# Patient Record
Sex: Male | Born: 1990 | Race: White | Hispanic: No | Marital: Single | Smoking: Current every day smoker
Health system: Southern US, Community
[De-identification: ages and names within clinical notes are randomized; demographics above are authoritative.]

## PROBLEM LIST (undated history)

## (undated) DIAGNOSIS — K219 Gastro-esophageal reflux disease without esophagitis: Secondary | ICD-10-CM

## (undated) DIAGNOSIS — IMO0001 Reserved for inherently not codable concepts without codable children: Secondary | ICD-10-CM

## (undated) DIAGNOSIS — A4902 Methicillin resistant Staphylococcus aureus infection, unspecified site: Secondary | ICD-10-CM

## (undated) DIAGNOSIS — K449 Diaphragmatic hernia without obstruction or gangrene: Secondary | ICD-10-CM

## (undated) HISTORY — DX: Diaphragmatic hernia without obstruction or gangrene: K44.9

## (undated) HISTORY — DX: Gastro-esophageal reflux disease without esophagitis: K21.9

---

## 2011-04-12 ENCOUNTER — Emergency Department: Payer: Self-pay | Admitting: Emergency Medicine

## 2011-04-28 ENCOUNTER — Emergency Department: Payer: Self-pay | Admitting: Emergency Medicine

## 2014-10-30 ENCOUNTER — Emergency Department: Payer: Self-pay | Admitting: Emergency Medicine

## 2014-10-30 LAB — CBC WITH DIFFERENTIAL/PLATELET
BASOS ABS: 0.1 10*3/uL (ref 0.0–0.1)
Basophil %: 0.4 %
Eosinophil #: 0.4 10*3/uL (ref 0.0–0.7)
Eosinophil %: 3 %
HCT: 46.3 % (ref 40.0–52.0)
HGB: 15.2 g/dL (ref 13.0–18.0)
LYMPHS ABS: 1.9 10*3/uL (ref 1.0–3.6)
Lymphocyte %: 13.9 %
MCH: 30.7 pg (ref 26.0–34.0)
MCHC: 32.9 g/dL (ref 32.0–36.0)
MCV: 93 fL (ref 80–100)
Monocyte #: 1 x10 3/mm (ref 0.2–1.0)
Monocyte %: 7.2 %
NEUTROS ABS: 10.4 10*3/uL — AB (ref 1.4–6.5)
Neutrophil %: 75.5 %
Platelet: 205 10*3/uL (ref 150–440)
RBC: 4.96 10*6/uL (ref 4.40–5.90)
RDW: 13.3 % (ref 11.5–14.5)
WBC: 13.7 10*3/uL — ABNORMAL HIGH (ref 3.8–10.6)

## 2014-10-30 LAB — COMPREHENSIVE METABOLIC PANEL
ALBUMIN: 4 g/dL (ref 3.4–5.0)
ALK PHOS: 88 U/L
AST: 46 U/L — AB (ref 15–37)
Anion Gap: 15 (ref 7–16)
BILIRUBIN TOTAL: 0.6 mg/dL (ref 0.2–1.0)
BUN: 10 mg/dL (ref 7–18)
CO2: 24 mmol/L (ref 21–32)
CREATININE: 0.8 mg/dL (ref 0.60–1.30)
Calcium, Total: 9 mg/dL (ref 8.5–10.1)
Chloride: 108 mmol/L — ABNORMAL HIGH (ref 98–107)
EGFR (African American): >60
EGFR (Non-African Amer.): >60
Glucose: 102 mg/dL — ABNORMAL HIGH (ref 65–99)
OSMOLALITY: 292 (ref 275–301)
Potassium: 4.5 mmol/L (ref 3.5–5.1)
SGPT (ALT): 42 U/L
Sodium: 147 mmol/L — ABNORMAL HIGH (ref 136–145)
Total Protein: 7.9 g/dL (ref 6.4–8.2)

## 2014-11-25 ENCOUNTER — Emergency Department: Payer: Self-pay | Admitting: Emergency Medicine

## 2014-12-24 ENCOUNTER — Emergency Department: Payer: Self-pay | Admitting: Emergency Medicine

## 2015-08-05 ENCOUNTER — Emergency Department
Admission: EM | Admit: 2015-08-05 | Discharge: 2015-08-05 | Disposition: A | Discharge status: Home / Self Care | Payer: Self-pay | Attending: Emergency Medicine | Admitting: Emergency Medicine

## 2015-08-05 ENCOUNTER — Encounter: Payer: Self-pay | Admitting: Emergency Medicine

## 2015-08-05 DIAGNOSIS — Z720 Tobacco use: Secondary | ICD-10-CM | POA: Insufficient documentation

## 2015-08-05 DIAGNOSIS — K625 Hemorrhage of anus and rectum: Secondary | ICD-10-CM | POA: Insufficient documentation

## 2015-08-05 LAB — CBC WITH DIFFERENTIAL/PLATELET
Basophils Absolute: 0.1 10*3/uL (ref 0–0.1)
Basophils Relative: 1 %
EOS ABS: 0.3 10*3/uL (ref 0–0.7)
EOS PCT: 3 %
HCT: 44.9 % (ref 40.0–52.0)
Hemoglobin: 15.2 g/dL (ref 13.0–18.0)
LYMPHS ABS: 2.2 10*3/uL (ref 1.0–3.6)
Lymphocytes Relative: 23 %
MCH: 30.4 pg (ref 26.0–34.0)
MCHC: 33.8 g/dL (ref 32.0–36.0)
MCV: 89.9 fL (ref 80.0–100.0)
Monocytes Absolute: 0.6 10*3/uL (ref 0.2–1.0)
Monocytes Relative: 6 %
Neutro Abs: 6.5 10*3/uL (ref 1.4–6.5)
Neutrophils Relative %: 67 %
PLATELETS: 182 10*3/uL (ref 150–440)
RBC: 5 MIL/uL (ref 4.40–5.90)
RDW: 13.6 % (ref 11.5–14.5)
WBC: 9.8 10*3/uL (ref 3.8–10.6)

## 2015-08-05 LAB — COMPREHENSIVE METABOLIC PANEL
ALT: 23 U/L (ref 17–63)
AST: 20 U/L (ref 15–41)
Albumin: 4.7 g/dL (ref 3.5–5.0)
Alkaline Phosphatase: 61 U/L (ref 38–126)
Anion gap: 6 (ref 5–15)
BUN: 10 mg/dL (ref 6–20)
CALCIUM: 9.4 mg/dL (ref 8.9–10.3)
CHLORIDE: 107 mmol/L (ref 101–111)
CO2: 27 mmol/L (ref 22–32)
Creatinine, Ser: 0.81 mg/dL (ref 0.61–1.24)
GFR calc non Af Amer: >60 mL/min (ref >60)
Glucose, Bld: 111 mg/dL — ABNORMAL HIGH (ref 65–99)
Potassium: 4.3 mmol/L (ref 3.5–5.1)
SODIUM: 140 mmol/L (ref 135–145)
Total Bilirubin: 0.7 mg/dL (ref 0.3–1.2)
Total Protein: 7.6 g/dL (ref 6.5–8.1)

## 2015-08-05 MED ORDER — HYDROCORTISONE ACETATE 25 MG RE SUPP: 25.0000 mg | Freq: Two times a day (BID) | RECTAL | Status: DC

## 2015-08-05 NOTE — ED Provider Notes (Signed)
[facility] Emergency Department Provider Note     Time seen: ----------------------------------------- 10:55 AM on 08/05/2015 -----------------------------------------    I have reviewed the triage vital signs and the nursing notes.   HISTORY  Chief Complaint Rectal Bleeding    HPI Marcus Hill is a 24 y.o. male who presents ER with bloody stools 3 this morning. Patient reports some pain rectally, has had this happen before with an anal fissure he states patient has describes lower abdominal pain, nothing makes it better or worse. Patient states had diarrhea for the last 2 months almost every day.   History reviewed. No pertinent past medical history.  There are no active problems to display for this patient.   History reviewed. No pertinent past surgical history.  Allergies Review of patient's allergies indicates no known allergies.  Social History Social History  Substance Use Topics  . Smoking status: Current Every Day Smoker  . Smokeless tobacco: None  . Alcohol Use: No    Review of Systems Constitutional: Negative for fever. Eyes: Negative for visual changes. ENT: Negative for sore throat. Cardiovascular: Negative for chest pain. Respiratory: Negative for shortness of breath. Gastrointestinal: Positive for abdominal pain and diarrhea, rectal bleeding Genitourinary: Negative for dysuria. Musculoskeletal: Negative for back pain. Skin: Negative for rash. Neurological: Negative for headaches, focal weakness or numbness.  10-point ROS otherwise negative.  ____________________________________________   PHYSICAL EXAM:  VITAL SIGNS: ED Triage Vitals  Enc Vitals Group     BP 08/05/15 1007 158/79 mmHg     Pulse Rate 08/05/15 1007 53     Resp 08/05/15 1007 20     Temp 08/05/15 1007 98.2 F (36.8 C)     Temp Source 08/05/15 1007 Oral     SpO2 08/05/15 1007 99 %     Weight 08/05/15 1007 203 lb (92.08 kg)     Height  08/05/15 1007  (1.727 m)     Head Cir --      Peak Flow --      Pain Score 08/05/15 1008 2     Pain Loc --      Pain Edu? --      Excl. in GC? --     Constitutional: Alert and oriented. Well appearing and in no distress. Eyes: Conjunctivae are normal. PERRL. Normal extraocular movements. ENT   Head: Normocephalic and atraumatic.   Nose: No congestion/rhinnorhea.   Mouth/Throat: Mucous membranes are moist.   Neck: No stridor. Cardiovascular: Normal rate, regular rhythm. Normal and symmetric distal pulses are present in all extremities. No murmurs, rubs, or gallops. Respiratory: Normal respiratory effort without tachypnea nor retractions. Breath sounds are clear and equal bilaterally. No wheezes/rales/rhonchi. Gastrointestinal: Soft and nontender. No distention. No abdominal bruits.  Rectal: No obvious hemorrhoids are appreciated, there was rectal tenderness present. He is heme-negative Musculoskeletal: Nontender with normal range of motion in all extremities. No joint effusions.  No lower extremity tenderness nor edema. Neurologic:  Normal speech and language. No gross focal neurologic deficits are appreciated. Speech is normal. No gait instability. Skin:  Skin is warm, dry and intact. No rash noted. Psychiatric: Mood and affect are normal. Speech and behavior are normal. Patient exhibits appropriate insight and judgment. ____________________________________________  ED COURSE:  Pertinent labs & imaging results that were available during my care of the patient were reviewed by me and considered in my medical decision making (see chart for details). We'll check basic labs and reevaluate. ____________________________________________    LABS (pertinent positives/negatives)  Labs Reviewed  COMPREHENSIVE METABOLIC PANEL - Abnormal; Notable for the following:    Glucose, Bld 111 (*)    All other components within normal limits  CBC WITH DIFFERENTIAL/PLATELET    ____________________________________________  FINAL ASSESSMENT AND PLAN  Rectal bleeding  Plan: Patient with labs as dictated above. Labs are unremarkable, he is stable for discharge with Anusol suppositories and GI referral.   Emily Filbert, MD   Emily Filbert, MD 08/05/15 1128

## 2015-08-05 NOTE — Discharge Instructions (Signed)
Rectal Bleeding °Rectal bleeding is when blood passes out of the anus. It is usually a sign that something is wrong. It may not be serious, but it should always be evaluated. Rectal bleeding may present as bright red blood or extremely dark stools. The color may range from dark red or maroon to black (like tar). It is important that the cause of rectal bleeding be identified so treatment can be started and the problem corrected. °CAUSES  °· Hemorrhoids. These are enlarged (dilated) blood vessels or veins in the anal or rectal area. °· Fistulas. These are abnormal, burrowing channels that usually run from inside the rectum to the skin around the anus. They can bleed. °· Anal fissures. This is a tear in the tissue of the anus. Bleeding occurs with bowel movements. °· Diverticulosis. This is a condition in which pockets or sacs project from the bowel wall. Occasionally, the sacs can bleed. °· Diverticulitis. This is an infection involving diverticulosis of the colon. °· Proctitis and colitis. These are conditions in which the rectum, colon, or both, can become inflamed and pitted (ulcerated). °· Polyps and cancer. Polyps are non-cancerous (benign) growths in the colon that may bleed. Certain types of polyps turn into cancer. °· Protrusion of the rectum. Part of the rectum can project from the anus and bleed. °· Certain medicines. °· Intestinal infections. °· Blood vessel abnormalities. °HOME CARE INSTRUCTIONS °· Eat a high-fiber diet to keep your stool soft. °· Limit activity. °· Drink enough fluids to keep your urine clear or pale yellow. °· Warm baths may be useful to soothe rectal pain. °· Follow up with your caregiver as directed. °SEEK IMMEDIATE MEDICAL CARE IF: °· You develop increased bleeding. °· You have black or dark red stools. °· You vomit blood or material that looks like coffee grounds. °· You have abdominal pain or tenderness. °· You have a fever. °· You feel weak, nauseous, or you faint. °· You have  severe rectal pain or you are unable to have a bowel movement. °MAKE SURE YOU: °· Understand these instructions. °· Will watch your condition. °· Will get help right away if you are not doing well or get worse. °Document Released: 04/10/2002 Document Revised: 01/11/2012 Document Reviewed: 04/05/2011 °Exit[employer]® Patient Information ©2015 [employer]. This information is not intended to replace advice given to you by your health care provider. Make sure you discuss any questions you have with your health care provider. ° °

## 2015-08-05 NOTE — ED Notes (Signed)
Pt to ed with c/o bloody stools x 3 this am.  Pt states bright red blood and lower abd pain.

## 2015-09-19 ENCOUNTER — Emergency Department
Admission: EM | Admit: 2015-09-19 | Discharge: 2015-09-19 | Disposition: A | Discharge status: Home / Self Care | Payer: 59 | Attending: Emergency Medicine | Admitting: Emergency Medicine

## 2015-09-19 ENCOUNTER — Encounter: Payer: Self-pay | Admitting: *Deleted

## 2015-09-19 DIAGNOSIS — R1110 Vomiting, unspecified: Secondary | ICD-10-CM

## 2015-09-19 DIAGNOSIS — R112 Nausea with vomiting, unspecified: Secondary | ICD-10-CM | POA: Insufficient documentation

## 2015-09-19 DIAGNOSIS — Z720 Tobacco use: Secondary | ICD-10-CM | POA: Insufficient documentation

## 2015-09-19 DIAGNOSIS — Z7952 Long term (current) use of systemic steroids: Secondary | ICD-10-CM | POA: Diagnosis not present

## 2015-09-19 HISTORY — DX: Methicillin resistant Staphylococcus aureus infection, unspecified site: A49.02

## 2015-09-19 LAB — LIPASE, BLOOD: LIPASE: 26 U/L (ref 11–51)

## 2015-09-19 LAB — COMPREHENSIVE METABOLIC PANEL
ALBUMIN: 4.4 g/dL (ref 3.5–5.0)
ALK PHOS: 58 U/L (ref 38–126)
ALT: 21 U/L (ref 17–63)
ANION GAP: 8 (ref 5–15)
AST: 19 U/L (ref 15–41)
BUN: 13 mg/dL (ref 6–20)
CALCIUM: 9.6 mg/dL (ref 8.9–10.3)
CO2: 26 mmol/L (ref 22–32)
Chloride: 106 mmol/L (ref 101–111)
Creatinine, Ser: 0.83 mg/dL (ref 0.61–1.24)
GFR calc non Af Amer: >60 mL/min (ref >60)
Glucose, Bld: 99 mg/dL (ref 65–99)
POTASSIUM: 3.7 mmol/L (ref 3.5–5.1)
SODIUM: 140 mmol/L (ref 135–145)
Total Bilirubin: 0.5 mg/dL (ref 0.3–1.2)
Total Protein: 7.7 g/dL (ref 6.5–8.1)

## 2015-09-19 LAB — CBC
HEMATOCRIT: 43.2 % (ref 40.0–52.0)
HEMOGLOBIN: 14.9 g/dL (ref 13.0–18.0)
MCH: 30.9 pg (ref 26.0–34.0)
MCHC: 34.6 g/dL (ref 32.0–36.0)
MCV: 89.3 fL (ref 80.0–100.0)
Platelets: 175 10*3/uL (ref 150–440)
RBC: 4.84 MIL/uL (ref 4.40–5.90)
RDW: 13.2 % (ref 11.5–14.5)
WBC: 8.5 10*3/uL (ref 3.8–10.6)

## 2015-09-19 MED ORDER — ONDANSETRON 4 MG PO TBDP: 4.0000 mg | ORAL_TABLET | Freq: Three times a day (TID) | ORAL | Status: DC | PRN

## 2015-09-19 MED ORDER — PROMETHAZINE HCL 25 MG RE SUPP: 25.0000 mg | Freq: Four times a day (QID) | RECTAL | Status: DC | PRN

## 2015-09-19 NOTE — ED Notes (Signed)
Vomiting past month, daily, states has lost 22 lbs

## 2015-09-19 NOTE — ED Provider Notes (Signed)
[facility] Emergency Department Provider Note  ____________________________________________  Time seen: Approximately 4:42 PM  I have reviewed the triage vital signs and the nursing notes.   HISTORY  Chief Complaint Emesis    HPI Marcus Hill is a 24 y.o. male , with a history of tobacco abuse and marijuana abuse, presenting with vomiting. Patient reports that over the last 2 months he has had daily vomiting. It usually occurs in the morning, and occasionally in the afternoon. This vomiting occurs before he eats anything. During the rest of the day, he is able to keep down foods but notices that he vomits more if he eats greasy or fast foods. He is able to tolerate high fiber foods like salad better. He reports a significant weight loss over the last 2 weeks. He denies any abdominal pain, diarrhea, constipation, headache, changes in gait, changes in vision or speech, changes in mental status, fever or chills. He has not had any urinary symptoms. He does report that he uses marijuana at least once a week.   Past Medical History  Diagnosis Date  . MRSA infection     There are no active problems to display for this patient.   History reviewed. No pertinent past surgical history.  Current Outpatient Rx  Name  Route  Sig  Dispense  Refill  . hydrocortisone (ANUSOL-HC) 25 MG suppository   Rectal   Place 1 suppository (25 mg total) rectally 2 (two) times daily.   12 suppository   0   . ondansetron (ZOFRAN ODT) 4 MG disintegrating tablet   Oral   Take 1 tablet (4 mg total) by mouth every 8 (eight) hours as needed for nausea or vomiting.   20 tablet   0   . promethazine (PHENERGAN) 25 MG suppository   Rectal   Place 1 suppository (25 mg total) rectally every 6 (six) hours as needed for nausea.   12 suppository   0     Allergies Review of patient's allergies indicates no known allergies.  No family history on file.  Social History Social  History  Substance Use Topics  . Smoking status: Current Every Day Smoker  . Smokeless tobacco: None  . Alcohol Use: No    Review of Systems Constitutional: No fever/chills. No lightheadedness or syncope. Eyes: No visual changes. No blurred or double vision. ENT: No sore throat. Cardiovascular: Denies chest pain, palpitations. Respiratory: Denies shortness of breath.  No cough. Gastrointestinal: No abdominal pain.  Positive nausea, positive vomiting.  No diarrhea.  No constipation. Genitourinary: Negative for dysuria. Musculoskeletal: Negative for back pain. Skin: Negative for rash. Neurological: Negative for headaches, focal weakness or numbness.  10-point ROS otherwise negative.  ____________________________________________   PHYSICAL EXAM:  VITAL SIGNS: ED Triage Vitals  Enc Vitals Group     BP 09/19/15 1553 161/98 mmHg     Pulse Rate 09/19/15 1553 74     Resp 09/19/15 1553 20     Temp 09/19/15 1553 99 F (37.2 C)     Temp Source 09/19/15 1553 Oral     SpO2 09/19/15 1553 98 %     Weight 09/19/15 1553 195 lb (88.451 kg)     Height 09/19/15 1553  (1.753 m)     Head Cir --      Peak Flow --      Pain Score 09/19/15 1554 5     Pain Loc --      Pain Edu? --  Excl. in GC? --     Constitutional: Alert and oriented. Well appearing and in no acute distress. Answer question appropriately. Eyes: Conjunctivae are normal.  EOMI. Head: Atraumatic. Nose: No congestion/rhinnorhea. Mouth/Throat: Mucous membranes are moist.  Neck: No stridor.  Supple.   Cardiovascular: Normal rate, regular rhythm. No murmurs, rubs or gallops.  Respiratory: Normal respiratory effort.  No retractions. Lungs CTAB.  No wheezes, rales or ronchi. Gastrointestinal: Abdomen is soft, nontender and nondistended. He has no guarding or rebound, negative Murphy sign, no peritoneal signs. Musculoskeletal: No LE edema.  Neurologic:  Normal speech and language. No gross focal neurologic deficits are  appreciated.  Skin:  Skin is warm, dry and intact. No rash noted. Normal skin turgor. Psychiatric: Mood and affect are normal. Speech and behavior are normal.  Normal judgement.  ____________________________________________   LABS (all labs ordered are listed, but only abnormal results are displayed)  Labs Reviewed  LIPASE, BLOOD  COMPREHENSIVE METABOLIC PANEL  CBC   ____________________________________________  EKG  Not indicated ____________________________________________  RADIOLOGY  No results found.  ____________________________________________   PROCEDURES  Procedure(s) performed: None  Critical Care performed: No ____________________________________________   INITIAL IMPRESSION / ASSESSMENT AND PLAN / ED COURSE  Pertinent labs & imaging results that were available during my care of the patient were reviewed by me and considered in my medical decision making (see chart for details).  24 y.o. male with chronic marijuana abuse presenting with 2 months of daily vomiting. It is possible that the etiology of his vomiting is his marijuana abuse. His vomiting may also be due to poor food choices. Given that it has been going on for 2 months, and he is afebrile with no white count, and acute viral or bacterial infection is very unlikely. I would also consider gastroparesis. This would also be an atypical symptom for GERD or PUD, especially since he has no associated pain. I would also consider an acute intracranial pathology such as a mass, but without any headache or any other neurologic symptoms or findings on exam, this is much less likely. My plan today will be to evaluate his labs, and if he has any major abnormalities, we'll consider CT scan. However given that this is unlikely an acute or surgical pathology, CT scan will not be indicated if labs are reassuring given his reassuring vital signs and exam.  ----------------------------------------- 5:10 PM on  09/19/2015 -----------------------------------------  The patient remained stable and has not had any vomiting here. His labs are reassuring. I have written his discharge paperwork so that he can establish a primary care physician, and also follow up with a GI specialist as needed. He understands return precautions and follow-up instructions.  ____________________________________________  FINAL CLINICAL IMPRESSION(S) / ED DIAGNOSES  Final diagnoses:  Chronic vomiting      NEW MEDICATIONS STARTED DURING THIS VISIT:  New Prescriptions   ONDANSETRON (ZOFRAN ODT) 4 MG DISINTEGRATING TABLET    Take 1 tablet (4 mg total) by mouth every 8 (eight) hours as needed for nausea or vomiting.   PROMETHAZINE (PHENERGAN) 25 MG SUPPOSITORY    Place 1 suppository (25 mg total) rectally every 6 (six) hours as needed for nausea.     Rockne MenghiniAnne-Caroline Eulon Allnutt, MD 09/19/15 1711

## 2015-09-19 NOTE — Discharge Instructions (Signed)
Please stop smoking marijuana, as this can cause chronic vomiting as you have been experiencing. Please also stay away from any greasy or fried foods. For the next 24 hours, please take a full liquid diet. After that, advance to bland BRAT diet as tolerated. Next  Please establish a primary care physician for follow-up.  Please return to the emergency department if you develop abdominal pain, inability to keep down fluids, fever, or any other symptoms concerning to you.

## 2015-09-19 NOTE — ED Notes (Signed)
AAOx3.  Skin warm and dry.  Ambulates with easy and steady gait.  Posture upright and relaxed.  NAD 

## 2015-09-19 NOTE — ED Notes (Signed)
AAOx3/.  Skin warm and dry.  NAD.  Ambulates with easy and steady gait.  Posture relaxed and upright.

## 2015-10-17 ENCOUNTER — Encounter: Payer: Self-pay | Admitting: Family Medicine

## 2015-10-17 ENCOUNTER — Ambulatory Visit (INDEPENDENT_AMBULATORY_CARE_PROVIDER_SITE_OTHER): Payer: 59 | Admitting: Family Medicine

## 2015-10-17 VITALS — BP 108/69 | HR 54 | Temp 97.7°F | Ht 67.25 in | Wt 190.0 lb

## 2015-10-17 DIAGNOSIS — R634 Abnormal weight loss: Secondary | ICD-10-CM | POA: Diagnosis not present

## 2015-10-17 DIAGNOSIS — K529 Noninfective gastroenteritis and colitis, unspecified: Secondary | ICD-10-CM

## 2015-10-17 DIAGNOSIS — Z23 Encounter for immunization: Secondary | ICD-10-CM

## 2015-10-17 DIAGNOSIS — Z720 Tobacco use: Secondary | ICD-10-CM | POA: Diagnosis not present

## 2015-10-17 MED ORDER — TETANUS-DIPHTH-ACELL PERTUSSIS 5-2.5-18.5 LF-MCG/0.5 IM SUSP
0.5000 mL | Freq: Once | INTRAMUSCULAR | Status: AC
  Administered 2015-10-17: 0.5 mL via INTRAMUSCULAR

## 2015-10-17 NOTE — Patient Instructions (Signed)
We'll get labs and stool studies today Stay hydrated I am here to help you quit smoking if and when you are ready to stop

## 2015-10-17 NOTE — Progress Notes (Signed)
BP 108/69 mmHg  Pulse 54  Temp(Src) 97.7 F (36.5 C)  Ht 5' 7.25" (1.708 m)  Wt 190 lb (86.183 kg)  BMI 29.54 kg/m2  SpO2 100%   Subjective:    Patient ID: Marcus Hill, male    DOB: 10-01-1991, 24 y.o.   MRN: [id number]  HPI: Marcus Hill is a 24 y.o. male  Chief Complaint  Patient presents with  . 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, foreign packages  Mother told him to change his diet; he is not a good water drinker He has some pain along the left upper quadrant, just with straining, picking something up  He went to the ER on August 05, 2015 and we reviewed that note Diarrhea started around August 2016, daily since then Tenderness on exam noted  He was 203 pounds in the ER in October, 190 pounds today  Glutaric acidemia, 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 Past Medical History  Diagnosis Date  . MRSA infection    History reviewed. No pertinent past surgical history. Family History  Problem Relation Age of Onset  . Hypertension Mother   . Hyperlipidemia Mother   . Diabetes Father   . Heart disease Father   . Heart attack  Father   . Diabetes Maternal Grandmother   . Diabetes Maternal Grandfather   . Diabetes Paternal Grandmother   . Diabetes Paternal Grandfather   . Cancer Neg Hx   . COPD Neg Hx   . Stroke Neg Hx    Social History  Substance Use Topics  . Smoking status: Current Every Day Smoker -- 0.50 packs/day    Types: Cigarettes  . Smokeless tobacco: Never Used  . Alcohol Use: No   Allergies and medications reviewed and updated.  Review of Systems Per HPI unless specifically indicated above     Objective:    BP 108/69 mmHg  Pulse 54  Temp(Src) 97.7 F (36.5 C)  Ht 5' 7.25" (1.708 m)  Wt 190 lb (86.183 kg)  BMI 29.54 kg/m2  SpO2 100%  Wt Readings from Last 3 Encounters:  10/17/15 190 lb (86.183 kg)  09/19/15 195 lb (88.451 kg)  08/05/15 203 lb (92.08 kg)    Physical Exam  Constitutional: He appears well-developed and well-nourished. No distress.  HENT:  Mouth/Throat: No oropharyngeal exudate.  Eyes: EOM are normal. No scleral icterus.  Neck: No JVD present. No thyromegaly present.  Cardiovascular: Normal rate and regular rhythm.   Pulmonary/Chest: Effort normal and breath sounds normal.  Abdominal: Soft. Bowel sounds are normal. He exhibits no distension and no mass. There  is no tenderness. There is no guarding.  Musculoskeletal: He exhibits no edema.  Lymphadenopathy:    He has no cervical adenopathy.  Neurological: He is alert.  Skin: Skin is warm and dry. He is not diaphoretic. No pallor.  Psychiatric: He has a normal mood and affect. His behavior is normal. Judgment and thought content normal.      Assessment & Plan:   Problem List Items Addressed This Visit      Digestive   Chronic diarrhea - Primary    Going on for months; will start with celiac testing, ANA, stool studies; reviewed ER records; will refer to GI if not obvious cause      Relevant Orders   ANA w/Reflex if Positive (Completed)   Gliadin antibodies, serum (Completed)   Tissue transglutaminase,  IgA (Completed)   Reticulin Antibody, IgA w reflex titer (Completed)   TSH (Completed)   Stool Culture (Completed)   Ova and parasite examination   C-reactive protein (Completed)   Comprehensive metabolic panel (Completed)   CBC with Differential/Platelet (Completed)   Giardia/Cryptosporidium EIA (Completed)     Other   Abnormal weight loss    With daily diarrhea, suspect the two are linked; ddx includes hyperthyroidism, celiac disease, inflammatory bowel disease; check labs, refer if no obvious etiology      Relevant Orders   TSH (Completed)   Giardia/Cryptosporidium EIA (Completed)   Tobacco abuse    Encouraged him to consider quitting, as it will be the best thing he could do for his health and his wallet; I am here to help if and when he is ready and would like assistance (referral to counseling, Rx for Chantix, etc.)       Other Visit Diagnoses    Encounter for immunization        Need for Tdap vaccination        offered and given today    Relevant Medications    Tdap (BOOSTRIX) injection 0.5 mL (Completed)    Flu vaccine need        flu vaccine offered, given today (per staff)       Follow up plan: Return 1-2 weeks, for follow-up, early morning appt please.  Orders Placed This Encounter  Procedures  . Stool Culture  . Ova and parasite examination  . Giardia/Cryptosporidium EIA  . Ova and parasite examination  . Flu Vaccine QUAD 36+ mos IM  . ANA w/Reflex if Positive  . Gliadin antibodies, serum  . Tissue transglutaminase, IgA  . Reticulin Antibody, IgA w reflex titer  . TSH  . C-reactive protein  . Comprehensive metabolic panel  . CBC with Differential/Platelet   Meds ordered this encounter  Medications  . Tdap (BOOSTRIX) injection 0.5 mL    Sig:    An after-visit summary was printed and given to the patient at check-out.  Please see the patient instructions which may contain other information and recommendations beyond what is mentioned above in the  assessment and plan.  Face-to-face time with patient was more than 30 minutes, >50% time spent counseling and coordination of care

## 2015-10-18 LAB — CBC WITH DIFFERENTIAL/PLATELET
BASOS ABS: 0 10*3/uL (ref 0.0–0.2)
Basos: 0 %
EOS (ABSOLUTE): 0.3 10*3/uL (ref 0.0–0.4)
Eos: 4 %
Hematocrit: 43.1 % (ref 37.5–51.0)
Hemoglobin: 14.6 g/dL (ref 12.6–17.7)
IMMATURE GRANS (ABS): 0 10*3/uL (ref 0.0–0.1)
Immature Granulocytes: 0 %
LYMPHS: 31 %
Lymphocytes Absolute: 2.5 10*3/uL (ref 0.7–3.1)
MCH: 30.6 pg (ref 26.6–33.0)
MCHC: 33.9 g/dL (ref 31.5–35.7)
MCV: 90 fL (ref 79–97)
Monocytes Absolute: 0.5 10*3/uL (ref 0.1–0.9)
Monocytes: 7 %
NEUTROS ABS: 4.7 10*3/uL (ref 1.4–7.0)
NEUTROS PCT: 58 %
PLATELETS: 214 10*3/uL (ref 150–379)
RBC: 4.77 x10E6/uL (ref 4.14–5.80)
RDW: 13.4 % (ref 12.3–15.4)
WBC: 8.1 10*3/uL (ref 3.4–10.8)

## 2015-10-18 LAB — COMPREHENSIVE METABOLIC PANEL
A/G RATIO: 2.4 (ref 1.1–2.5)
ALT: 15 IU/L (ref 0–44)
AST: 12 IU/L (ref 0–40)
Albumin: 4.7 g/dL (ref 3.5–5.5)
Alkaline Phosphatase: 64 IU/L (ref 39–117)
BILIRUBIN TOTAL: 0.3 mg/dL (ref 0.0–1.2)
BUN/Creatinine Ratio: 11 (ref 8–19)
BUN: 10 mg/dL (ref 6–20)
CHLORIDE: 103 mmol/L (ref 96–106)
CO2: 25 mmol/L (ref 18–29)
Calcium: 9.5 mg/dL (ref 8.7–10.2)
Creatinine, Ser: 0.87 mg/dL (ref 0.76–1.27)
GFR calc non Af Amer: 122 mL/min/{1.73_m2} (ref >59)
GFR, EST AFRICAN AMERICAN: 141 mL/min/{1.73_m2} (ref >59)
Globulin, Total: 2 g/dL (ref 1.5–4.5)
Glucose: 78 mg/dL (ref 65–99)
POTASSIUM: 4.5 mmol/L (ref 3.5–5.2)
Sodium: 144 mmol/L (ref 134–144)
Total Protein: 6.7 g/dL (ref 6.0–8.5)

## 2015-10-18 LAB — ANA W/REFLEX IF POSITIVE: ANA: NEGATIVE

## 2015-10-18 LAB — C-REACTIVE PROTEIN: CRP: 1.4 mg/L (ref 0.0–4.9)

## 2015-10-18 LAB — TSH: TSH: 0.535 u[IU]/mL (ref 0.450–4.500)

## 2015-10-18 LAB — GLIADIN ANTIBODIES, SERUM
ANTIGLIADIN ABS, IGA: 4 U (ref 0–19)
Gliadin IgG: 3 units (ref 0–19)

## 2015-10-19 LAB — RETICULIN ANTIBODIES, IGA W TITER: RETICULIN AB, IGA: NEGATIVE {titer}

## 2015-10-21 LAB — TISSUE TRANSGLUTAMINASE, IGA: Transglutaminase IgA: <2 U/mL (ref 0–3)

## 2015-10-23 LAB — GIARDIA/CRYPTOSPORIDIUM EIA
Cryptosporidium EIA: NEGATIVE
GIARDIA AG STL: NEGATIVE

## 2015-10-24 LAB — OVA AND PARASITE EXAMINATION

## 2015-10-25 ENCOUNTER — Telehealth: Payer: Self-pay

## 2015-10-25 LAB — STOOL CULTURE

## 2015-10-25 NOTE — Telephone Encounter (Signed)
Tried calling patient about results for ova and parasite lab.  No answer and no voicemail box set up yet. Will try again later.

## 2015-10-25 NOTE — Telephone Encounter (Signed)
Patient notified

## 2015-10-28 NOTE — Assessment & Plan Note (Signed)
Going on for months; will start with celiac testing, ANA, stool studies; reviewed ER records; will refer to GI if not obvious cause

## 2015-10-28 NOTE — Assessment & Plan Note (Signed)
With daily diarrhea, suspect the two are linked; ddx includes hyperthyroidism, celiac disease, inflammatory bowel disease; check labs, refer if no obvious etiology

## 2015-10-28 NOTE — Assessment & Plan Note (Signed)
Encouraged him to consider quitting, as it will be the best thing he could do for his health and his wallet; I am here to help if and when he is ready and would like assistance (referral to counseling, Rx for Chantix, etc.)

## 2015-10-31 ENCOUNTER — Ambulatory Visit (INDEPENDENT_AMBULATORY_CARE_PROVIDER_SITE_OTHER): Payer: 59 | Admitting: Family Medicine

## 2015-10-31 ENCOUNTER — Encounter: Payer: Self-pay | Admitting: Family Medicine

## 2015-10-31 VITALS — BP 100/68 | HR 60 | Temp 97.8°F | Wt 189.0 lb

## 2015-10-31 DIAGNOSIS — Z720 Tobacco use: Secondary | ICD-10-CM | POA: Diagnosis not present

## 2015-10-31 DIAGNOSIS — R634 Abnormal weight loss: Secondary | ICD-10-CM | POA: Diagnosis not present

## 2015-10-31 DIAGNOSIS — R12 Heartburn: Secondary | ICD-10-CM | POA: Diagnosis not present

## 2015-10-31 DIAGNOSIS — K0889 Other specified disorders of teeth and supporting structures: Secondary | ICD-10-CM | POA: Diagnosis not present

## 2015-10-31 DIAGNOSIS — K529 Noninfective gastroenteritis and colitis, unspecified: Secondary | ICD-10-CM

## 2015-10-31 DIAGNOSIS — R1012 Left upper quadrant pain: Secondary | ICD-10-CM

## 2015-10-31 MED ORDER — HYDROCODONE-ACETAMINOPHEN 5-325 MG PO TABS: 1.0000 | ORAL_TABLET | ORAL | Status: DC | PRN

## 2015-10-31 MED ORDER — OMEPRAZOLE 40 MG PO CPDR: 40.0000 mg | DELAYED_RELEASE_CAPSULE | Freq: Every day | ORAL | Status: DC

## 2015-10-31 NOTE — Assessment & Plan Note (Signed)
Add PPI; avoid triggers; try to quit smoking (he is not interested); elevate HOB

## 2015-10-31 NOTE — Progress Notes (Signed)
BP 100/68 mmHg  Pulse 60  Temp(Src) 97.8 F (36.6 C)  Wt 189 lb (85.73 kg)  SpO2 98%   Subjective:    Patient ID: Marcus Hill, male    DOB: 1991-04-19, 24 y.o.   MRN: [id number]  HPI: Marcus Hill is a 24 y.o. male  Chief Complaint  Patient presents with  . Diarrhea    2 week follow up; has not slowed down any. Not vomiting as much as he was.   He is always hungry; appetite is good No abdominal pain except for the LUQ; does get burning taste in his esophagus and up into the throat He was in the ER twice already for this; they did not get any imaging, gave him medicine for nausea, but put a band-aid All labs have come back and we reviewed those today  Nausea is better, mostly in the morning; when he vomits, he does not see blood No family hx of esophageal or stomach cancer He is smoking 1/2 ppd; started smoking sophomore year of high school  Maternal grandmother had her spleen removed when she was about 23 years old; she had been complaining of LUQ for a while and they worked her up and they told her that her spleen was dead; she had it removed and then died shortly thereafter  He had a tooth problem yesterday, was at dentist yesterday and they are going to do a root canal; upper premolar; 8 out of 10 pain; laying on floor of bathroom, was crying in his car from pain; could not eat Christmas dinner  Relevant past medical, surgical, family and social history reviewed and updated as indicated. Interim medical history since our last visit reviewed. Allergies and medications reviewed and updated.  Review of Systems  Per HPI unless specifically indicated above     Objective:    BP 100/68 mmHg  Pulse 60  Temp(Src) 97.8 F (36.6 C)  Wt 189 lb (85.73 kg)  SpO2 98%  Wt Readings from Last 3 Encounters:  10/31/15 189 lb (85.73 kg)  10/17/15 190 lb (86.183 kg)  09/19/15 195 lb (88.451 kg)    Physical Exam  Constitutional: He appears well-developed and  well-nourished. No distress.  Weight down one pound over last two weeks  HENT:  Mouth/Throat: Mucous membranes are normal. Abnormal dentition. No oropharyngeal exudate.  Tooth #14 (I believe) has evidence of recent dental work, anterior half white filling material; no surrounding gum edema or erythema  Eyes: EOM are normal. No scleral icterus.  Neck: No JVD present. No thyromegaly present.  Cardiovascular: Normal rate and regular rhythm.   Pulmonary/Chest: Effort normal and breath sounds normal.  Abdominal: Soft. Bowel sounds are normal. He exhibits no distension and no mass. There is tenderness (LUQ, no masses, no HSM). There is no guarding.  Musculoskeletal: He exhibits no edema.  Lymphadenopathy:    He has no cervical adenopathy.  Neurological: He is alert.  Skin: Skin is warm and dry. He is not diaphoretic. No pallor.  Psychiatric: He has a normal mood and affect. His behavior is normal. Judgment and thought content normal.   Results for orders placed or performed in visit on 10/17/15  Stool Culture  Result Value Ref Range   Campylobacter Culture Final report    RESULT 1 Comment   Giardia/Cryptosporidium EIA  Result Value Ref Range   Giardia Ag, Stl Negative Negative   Cryptosporidium EIA Negative Negative  Ova and parasite examination  Result Value Ref Range  OVA + PARASITE EXAM Final report    O&P result 1 Comment   ANA w/Reflex if Positive  Result Value Ref Range   Anit Nuclear Antibody(ANA) Negative Negative  Gliadin antibodies, serum  Result Value Ref Range   Antigliadin Abs, IgA 4 0 - 19 units   Gliadin IgG 3 0 - 19 units  Tissue transglutaminase, IgA  Result Value Ref Range   Transglutaminase IgA <2 0 - 3 U/mL  Reticulin Antibody, IgA w reflex titer  Result Value Ref Range   Reticulin Ab, IgA Negative Neg:<1:2.5 titer  TSH  Result Value Ref Range   TSH 0.535 0.450 - 4.500 uIU/mL  C-reactive protein  Result Value Ref Range   CRP 1.4 0.0 - 4.9 mg/L   Comprehensive metabolic panel  Result Value Ref Range   Glucose 78 65 - 99 mg/dL   BUN 10 6 - 20 mg/dL   Creatinine, Ser 1.61 0.76 - 1.27 mg/dL   GFR calc non Af Amer 122 >59 mL/min/1.73   GFR calc Af Amer 141 >59 mL/min/1.73   BUN/Creatinine Ratio 11 8 - 19   Sodium 144 134 - 144 mmol/L   Potassium 4.5 3.5 - 5.2 mmol/L   Chloride 103 96 - 106 mmol/L   CO2 25 18 - 29 mmol/L   Calcium 9.5 8.7 - 10.2 mg/dL   Total Protein 6.7 6.0 - 8.5 g/dL   Albumin 4.7 3.5 - 5.5 g/dL   Globulin, Total 2.0 1.5 - 4.5 g/dL   Albumin/Globulin Ratio 2.4 1.1 - 2.5   Bilirubin Total 0.3 0.0 - 1.2 mg/dL   Alkaline Phosphatase 64 39 - 117 IU/L   AST 12 0 - 40 IU/L   ALT 15 0 - 44 IU/L  CBC with Differential/Platelet  Result Value Ref Range   WBC 8.1 3.4 - 10.8 x10E3/uL   RBC 4.77 4.14 - 5.80 x10E6/uL   Hemoglobin 14.6 12.6 - 17.7 g/dL   Hematocrit 09.6 04.5 - 51.0 %   MCV 90 79 - 97 fL   MCH 30.6 26.6 - 33.0 pg   MCHC 33.9 31.5 - 35.7 g/dL   RDW 40.9 81.1 - 91.4 %   Platelets 214 150 - 379 x10E3/uL   Neutrophils 58 %   Lymphs 31 %   Monocytes 7 %   Eos 4 %   Basos 0 %   Neutrophils Absolute 4.7 1.4 - 7.0 x10E3/uL   Lymphocytes Absolute 2.5 0.7 - 3.1 x10E3/uL   Monocytes Absolute 0.5 0.1 - 0.9 x10E3/uL   EOS (ABSOLUTE) 0.3 0.0 - 0.4 x10E3/uL   Basophils Absolute 0.0 0.0 - 0.2 x10E3/uL   Immature Granulocytes 0 %   Immature Grans (Abs) 0.0 0.0 - 0.1 x10E3/uL      Assessment & Plan:   Problem List Items Addressed This Visit      Digestive   Chronic diarrhea    Everything that I have checked so far is negative; will refer to GI for evaluation and treatment      Relevant Orders   Ambulatory referral to Gastroenterology   CT Abdomen Pelvis W Contrast     Other   Abnormal weight loss    ddx includes hyperthyroidism, malabsorption, but also cancer so get CT scan and refer to GI; offered to help quit smoking, he is just not ready; recheck TSH in 2 months (mid-Feb)      Relevant Orders    Ambulatory referral to Gastroenterology   CT Abdomen Pelvis W Contrast   Tobacco abuse  Explained that smoking can increase risk of stomach cancer, esophageal cancer, but he is not interested in trying to quit smoking today; I am here to help if and when he is ready      Left upper quadrant pain - Primary    Will get CT scan and refer to GI specialist for consideration of EGD      Relevant Orders   Ambulatory referral to Gastroenterology   CT Abdomen Pelvis W Contrast   Heartburn    Add PPI; avoid triggers; try to quit smoking (he is not interested); elevate HOB      Pain, dental    Saw dentist yesterday; I do not want him taking the ibuprofen; I will provide limited Rx for hydrocodone; do NOT mix with alcohol, sleeping pills, other pain pills, etc.; he will f/u with dentist; do NOT drive or operate machinery for 4 to 6 hours after taking; he can take plain tylenol during the day         Follow up plan: Return 10-12 days, for follow-up.  An after-visit summary was printed and given to the patient at check-out.  Please see the patient instructions which may contain other information and recommendations beyond what is mentioned above in the assessment and plan. Orders Placed This Encounter  Procedures  . CT Abdomen Pelvis W Contrast  . Ambulatory referral to Gastroenterology   Meds ordered this encounter  Medications  . DISCONTD: ibuprofen (ADVIL,MOTRIN) 600 MG tablet    Sig: Take 600 mg by mouth every 6 (six) hours as needed.  Marland Kitchen. HYDROcodone-acetaminophen (NORCO/VICODIN) 5-325 MG tablet    Sig: Take 1 tablet by mouth every 4 (four) hours as needed for moderate pain. Do not drive or operate machinery    Dispense:  10 tablet    Refill:  0  . omeprazole (PRILOSEC) 40 MG capsule    Sig: Take 1 capsule (40 mg total) by mouth daily.    Dispense:  30 capsule    Refill:  1

## 2015-10-31 NOTE — Assessment & Plan Note (Signed)
Explained that smoking can increase risk of stomach cancer, esophageal cancer, but he is not interested in trying to quit smoking today; I am here to help if and when he is ready

## 2015-10-31 NOTE — Assessment & Plan Note (Signed)
ddx includes hyperthyroidism, malabsorption, but also cancer so get CT scan and refer to GI; offered to help quit smoking, he is just not ready; recheck TSH in 2 months (mid-Feb)

## 2015-10-31 NOTE — Patient Instructions (Addendum)
GINA -- out of work today I will recommend that you don't take the ibuprofen if you can help it Use the hydrocodone if needed for moderate to severe pain, but do not drive or operate heavy machinery for four to six hours after taking it We'll get a CT scan of your abdomen and send you to a gastroenterologist Start the new acid reducer Avoid triggers like caffeine, chocolate, onions, peppers, spaghetti sauce, pizza, spicy foods, etc. Elevate the head of your bed a little Call if getting worse Start a multiple vitamin and maybe Ensure or Boost

## 2015-10-31 NOTE — Assessment & Plan Note (Signed)
Will get CT scan and refer to GI specialist for consideration of EGD

## 2015-10-31 NOTE — Assessment & Plan Note (Signed)
Everything that I have checked so far is negative; will refer to GI for evaluation and treatment

## 2015-10-31 NOTE — Assessment & Plan Note (Signed)
Saw dentist yesterday; I do not want him taking the ibuprofen; I will provide limited Rx for hydrocodone; do NOT mix with alcohol, sleeping pills, other pain pills, etc.; he will f/u with dentist; do NOT drive or operate machinery for 4 to 6 hours after taking; he can take plain tylenol during the day

## 2015-11-01 ENCOUNTER — Ambulatory Visit
Admission: RE | Admit: 2015-11-01 | Discharge: 2015-11-01 | Disposition: A | Discharge status: Home / Self Care | Payer: Commercial Managed Care - HMO | Source: Ambulatory Visit | Attending: Family Medicine | Admitting: Family Medicine

## 2015-11-01 DIAGNOSIS — R1012 Left upper quadrant pain: Secondary | ICD-10-CM | POA: Diagnosis present

## 2015-11-01 DIAGNOSIS — R634 Abnormal weight loss: Secondary | ICD-10-CM | POA: Insufficient documentation

## 2015-11-01 DIAGNOSIS — K529 Noninfective gastroenteritis and colitis, unspecified: Secondary | ICD-10-CM | POA: Insufficient documentation

## 2015-11-01 MED ORDER — IOHEXOL 300 MG/ML  SOLN
100.0000 mL | Freq: Once | INTRAMUSCULAR | Status: AC | PRN
  Administered 2015-11-01: 100 mL via INTRAVENOUS

## 2015-11-21 ENCOUNTER — Encounter: Payer: Self-pay | Admitting: Gastroenterology

## 2015-11-21 ENCOUNTER — Encounter (INDEPENDENT_AMBULATORY_CARE_PROVIDER_SITE_OTHER): Payer: Self-pay

## 2015-11-21 ENCOUNTER — Other Ambulatory Visit: Payer: Self-pay

## 2015-11-21 ENCOUNTER — Ambulatory Visit (INDEPENDENT_AMBULATORY_CARE_PROVIDER_SITE_OTHER): Payer: 59 | Admitting: Gastroenterology

## 2015-11-21 VITALS — BP 124/65 | HR 60 | Temp 98.3°F | Ht 68.0 in | Wt 182.0 lb

## 2015-11-21 DIAGNOSIS — R634 Abnormal weight loss: Secondary | ICD-10-CM | POA: Diagnosis not present

## 2015-11-21 DIAGNOSIS — R1012 Left upper quadrant pain: Secondary | ICD-10-CM | POA: Diagnosis not present

## 2015-11-21 DIAGNOSIS — R197 Diarrhea, unspecified: Secondary | ICD-10-CM

## 2015-11-21 NOTE — Telephone Encounter (Signed)
ERROR

## 2015-11-21 NOTE — Progress Notes (Signed)
Gastroenterology Consultation  Referring Provider:     Kerman Passey, MD Primary Care Physician:  Baruch Gouty, MD Primary Gastroenterologist:  Dr. Servando Snare     Reason for Consultation:     Diarrhea and weight loss        HPI:   Marcus Hill is a 25 y.o. y/o male referred for consultation & management of diarrhea and weight loss by Dr. Baruch Gouty, MD.  As patient comes today with a history of weight loss over the last 4 months. The patient states that prior to 4 months ago he was doing fine without any complaints. The patient was weighing in at 217 pounds 4 months ago and states he was as high as 225 pounds. The patient now is 182 pounds without trying. The patient does report that in the morning he has some nausea with vomiting but nothing throughout the rest today. There is no report of any black stools or bloody stools. He also denies at the diarrhea wakes him up from a sleep. The patient has had pain in the left upper quadrant in the past but states it is not hurting at the present time. There is no family history of colon cancer colon polyps. The patient also denies any family history inflammatory bowel disease such as Crohn's disease or ulcerative colitis. He is now coming to me 4 months after this started because he states he did not seek medical attention for some time prior to seeing his primary care provider. The patient had a CRP that was normal and stool studies that were also normal.  Past Medical History  Diagnosis Date  . MRSA infection   . GERD (gastroesophageal reflux disease)     History reviewed. No pertinent past surgical history.  Prior to Admission medications   Medication Sig Start Date End Date Taking? Authorizing Provider  omeprazole (PRILOSEC) 40 MG capsule Take 1 capsule (40 mg total) by mouth daily. 10/31/15  Yes Kerman Passey, MD    Family History  Problem Relation Age of Onset  . Hypertension Mother   . Hyperlipidemia Mother   . Diabetes Father     . Heart disease Father   . Heart attack Father   . Diabetes Maternal Grandmother   . Diabetes Maternal Grandfather   . Diabetes Paternal Grandmother   . Diabetes Paternal Grandfather   . Cancer Neg Hx   . COPD Neg Hx   . Stroke Neg Hx      Social History  Substance Use Topics  . Smoking status: Current Every Day Smoker -- 0.50 packs/day    Types: Cigarettes  . Smokeless tobacco: Never Used  . Alcohol Use: No    Allergies as of 11/21/2015  . (No Known Allergies)    Review of Systems:    All systems reviewed and negative except where noted in HPI.   Physical Exam:  BP 124/65 mmHg  Pulse 60  Temp(Src) 98.3 F (36.8 C) (Oral)  Ht  (1.727 m)  Wt 182 lb (82.555 kg)  BMI 27.68 kg/m2 No LMP for male patient. Psych:  Alert and cooperative. Normal mood and affect. General:   Alert,  Well-developed, well-nourished, pleasant and cooperative in NAD Head:  Normocephalic and atraumatic. Eyes:  Sclera clear, no icterus.   Conjunctiva pink. Ears:  Normal auditory acuity. Nose:  No deformity, discharge, or lesions. Mouth:  No deformity or lesions,oropharynx pink & moist. Neck:  Supple; no masses or thyromegaly. Lungs:  Respirations even and unlabored.  Clear throughout to auscultation.   No wheezes, crackles, or rhonchi. No acute distress. Heart:  Regular rate and rhythm; no murmurs, clicks, rubs, or gallops. Abdomen:  Normal bowel sounds.  No bruits.  Soft, non-tender and non-distended without masses, hepatosplenomegaly or hernias noted.  No guarding or rebound tenderness.  Negative Carnett sign.   Rectal:  Deferred.  Msk:  Symmetrical without gross deformities.  Good, equal movement & strength bilaterally. Pulses:  Normal pulses noted. Extremities:  No clubbing or edema.  No cyanosis. Neurologic:  Alert and oriented x3;  grossly normal neurologically. Skin:  Intact without significant lesions or rashes.  No jaundice. Lymph Nodes:  No significant cervical adenopathy. Psych:   Alert and cooperative. Normal mood and affect.  Imaging Studies: Ct Abdomen Pelvis W Contrast  11/01/2015  CLINICAL DATA:  Nausea, vomiting and diarrhea, worsening over the past 2 months. Lost approximately 30 pounds. EXAM: CT ABDOMEN AND PELVIS WITH CONTRAST TECHNIQUE: Multidetector CT imaging of the abdomen and pelvis was performed using the standard protocol following bolus administration of intravenous contrast. CONTRAST:  OMNIPAQUE IOHEXOL 300 MG/ML  SOLN COMPARISON:  None. FINDINGS: Lower chest:  Minimal dependent atelectasis at both lung bases. Hepatobiliary: No masses or other significant abnormality. Pancreas: No mass, inflammatory changes, or other significant abnormality. Spleen: Within normal limits in size and appearance. Adrenals/Urinary Tract: No masses identified. No evidence of hydronephrosis. Stomach/Bowel: No evidence of obstruction, inflammatory process, or abnormal fluid collections. Vascular/Lymphatic: No pathologically enlarged lymph nodes. No evidence of abdominal aortic aneurysm. Reproductive: No mass or other significant abnormality. Other: None. Musculoskeletal:  No suspicious bone lesions identified. IMPRESSION: No significant abnormality. Electronically Signed   By: Beckie Salts M.D.   On: 11/01/2015 16:12    Assessment and Plan:   [name] is a 25 y.o. y/o male who comes in today with a history of significant weight loss from 225-182. The patient also has had diarrhea with morning vomiting. The patient's morning vomiting is likely due to nocturnal reflux. The most likely cause of this patient's symptoms is inflammatory bowel disease although the CRP was negative. He had a blood test for celiac sprue which was also negative. The patient will be set up for an EGD and colonoscopy. If these are negative the patient may need a small bowel follow-through and also may need his stool checked for fecal fat or pancreatic elastase. I have discussed risks & benefits which  include, but are not limited to, bleeding, infection, perforation & drug reaction.  The patient agrees with this plan & written consent will be obtained.       Note: This dictation was prepared with Dragon dictation along with smaller phrase technology. Any transcriptional errors that result from this process are unintentional.

## 2015-12-04 ENCOUNTER — Encounter: Payer: Self-pay | Admitting: Family Medicine

## 2015-12-04 ENCOUNTER — Ambulatory Visit (INDEPENDENT_AMBULATORY_CARE_PROVIDER_SITE_OTHER): Payer: 59 | Admitting: Family Medicine

## 2015-12-04 VITALS — BP 129/72 | HR 64 | Temp 98.2°F | Wt 182.0 lb

## 2015-12-04 DIAGNOSIS — K529 Noninfective gastroenteritis and colitis, unspecified: Secondary | ICD-10-CM | POA: Diagnosis not present

## 2015-12-04 DIAGNOSIS — R112 Nausea with vomiting, unspecified: Secondary | ICD-10-CM

## 2015-12-04 MED ORDER — PROMETHAZINE HCL 25 MG PO TABS: 25.0000 mg | ORAL_TABLET | Freq: Four times a day (QID) | ORAL | Status: DC | PRN

## 2015-12-04 MED ORDER — ONDANSETRON HCL 4 MG PO TABS: 4.0000 mg | ORAL_TABLET | Freq: Three times a day (TID) | ORAL | Status: DC | PRN

## 2015-12-04 NOTE — Patient Instructions (Addendum)
Continue food restrictions Use either Zofran (ondansetron) OR Phenergan (promethazine) for nausea If you take Phenergan (Promethazine), don't drive if it makes you sleep Marcus Hill --> note for patient to be out this afternoon

## 2015-12-04 NOTE — Progress Notes (Signed)
BP 129/72 mmHg  Pulse 64  Temp(Src) 98.2 F (36.8 C)  Wt 182 lb (82.555 kg)  SpO2 98%   Subjective:    Patient ID: Marcus Hill, male    DOB: 29-Jun-1991, 25 y.o.   MRN: [id number]  HPI: Marcus Hill is a 25 y.o. male  Chief Complaint  Patient presents with  . Emesis    he has alot of nausea and vomiting and wants to know if he can get something for it. He saw GI on the 19th and has a colonoscopy scheduled for Monday.   He has been seen by GI; last note reviewed Uncontrollable vomiting through the day now Used to be just in the morning Sometimes now, it can last through the day If he starts to sweat, he feels nauseated (mother is that way too, she comments) Doesn't even have to be hot though; the guys at work were sweating and vomiting and it was horrible Thyroid test in December was normal He does not have anything anything for nausea; he was given something at the ER months ago, but does not know what it was; we reviewed the ER record and found that it was ondansetron 4 mg He is taking omeprazole for the acid reflux; could not tell any benefit, but stopped and has not had any stopped taking the vitamins too Will start taking vitamins again after the procedures   Relevant past medical, surgical, family and social history reviewed and updated as indicated. Interim medical history since our last visit reviewed. Allergies and medications reviewed and updated.  Review of Systems  Per HPI unless specifically indicated above     Objective:    BP 129/72 mmHg  Pulse 64  Temp(Src) 98.2 F (36.8 C)  Wt 182 lb (82.555 kg)  SpO2 98%  Wt Readings from Last 3 Encounters:  12/04/15 182 lb (82.555 kg)  11/21/15 182 lb (82.555 kg)  12/03/15 182 lb (82.555 kg)    Physical Exam  Constitutional: He appears well-developed and well-nourished. No distress.  Cardiovascular: Normal rate and regular rhythm.   Pulmonary/Chest: Effort normal and breath sounds normal.   Abdominal: Soft. Bowel sounds are normal. He exhibits no distension and no mass. There is no tenderness.  Skin: No pallor.  Psychiatric: He has a normal mood and affect.   Results for orders placed or performed in visit on 10/17/15  Stool Culture  Result Value Ref Range   Campylobacter Culture Final report    RESULT 1 Comment   Giardia/Cryptosporidium EIA  Result Value Ref Range   Giardia Ag, Stl Negative Negative   Cryptosporidium EIA Negative Negative  Ova and parasite examination  Result Value Ref Range   OVA + PARASITE EXAM Final report    O&P result 1 Comment   ANA w/Reflex if Positive  Result Value Ref Range   Anit Nuclear Antibody(ANA) Negative Negative  Gliadin antibodies, serum  Result Value Ref Range   Antigliadin Abs, IgA 4 0 - 19 units   Gliadin IgG 3 0 - 19 units  Tissue transglutaminase, IgA  Result Value Ref Range   Transglutaminase IgA <2 0 - 3 U/mL  Reticulin Antibody, IgA w reflex titer  Result Value Ref Range   Reticulin Ab, IgA Negative Neg:<1:2.5 titer  TSH  Result Value Ref Range   TSH 0.535 0.450 - 4.500 uIU/mL  C-reactive protein  Result Value Ref Range   CRP 1.4 0.0 - 4.9 mg/L  Comprehensive metabolic panel  Result Value Ref Range  Glucose 78 65 - 99 mg/dL   BUN 10 6 - 20 mg/dL   Creatinine, Ser 1.61 0.76 - 1.27 mg/dL   GFR calc non Af Amer 122 >59 mL/min/1.73   GFR calc Af Amer 141 >59 mL/min/1.73   BUN/Creatinine Ratio 11 8 - 19   Sodium 144 134 - 144 mmol/L   Potassium 4.5 3.5 - 5.2 mmol/L   Chloride 103 96 - 106 mmol/L   CO2 25 18 - 29 mmol/L   Calcium 9.5 8.7 - 10.2 mg/dL   Total Protein 6.7 6.0 - 8.5 g/dL   Albumin 4.7 3.5 - 5.5 g/dL   Globulin, Total 2.0 1.5 - 4.5 g/dL   Albumin/Globulin Ratio 2.4 1.1 - 2.5   Bilirubin Total 0.3 0.0 - 1.2 mg/dL   Alkaline Phosphatase 64 39 - 117 IU/L   AST 12 0 - 40 IU/L   ALT 15 0 - 44 IU/L  CBC with Differential/Platelet  Result Value Ref Range   WBC 8.1 3.4 - 10.8 x10E3/uL   RBC 4.77  4.14 - 5.80 x10E6/uL   Hemoglobin 14.6 12.6 - 17.7 g/dL   Hematocrit 09.6 04.5 - 51.0 %   MCV 90 79 - 97 fL   MCH 30.6 26.6 - 33.0 pg   MCHC 33.9 31.5 - 35.7 g/dL   RDW 40.9 81.1 - 91.4 %   Platelets 214 150 - 379 x10E3/uL   Neutrophils 58 %   Lymphs 31 %   Monocytes 7 %   Eos 4 %   Basos 0 %   Neutrophils Absolute 4.7 1.4 - 7.0 x10E3/uL   Lymphocytes Absolute 2.5 0.7 - 3.1 x10E3/uL   Monocytes Absolute 0.5 0.1 - 0.9 x10E3/uL   EOS (ABSOLUTE) 0.3 0.0 - 0.4 x10E3/uL   Basophils Absolute 0.0 0.0 - 0.2 x10E3/uL   Immature Granulocytes 0 %   Immature Grans (Abs) 0.0 0.0 - 0.1 x10E3/uL      Assessment & Plan:   Problem List Items Addressed This Visit      Digestive   Chronic diarrhea    May be an absorption issue; he is going to have EGD and colonoscopy next week; expect biopsies will likely be done and I so appreciate the help of the       Nausea and vomiting - Primary    Will start anti-emetic; he will f/u with GI specialist and undergo EGD and colonoscopy on Monday; food restrictions         Follow up plan: Return if symptoms worsen or fail to improve.  Meds ordered this encounter  Medications  . ondansetron (ZOFRAN) 4 MG tablet    Sig: Take 1 tablet (4 mg total) by mouth every 8 (eight) hours as needed for nausea or vomiting.    Dispense:  20 tablet    Refill:  0    *Please call me for phenergan Rx if insurance doesn't cover this*  . promethazine (PHENERGAN) 25 MG tablet    Sig: Take 1 tablet (25 mg total) by mouth every 6 (six) hours as needed for nausea or vomiting. Do NOT take with ondansetron (Zofran); this can make you sleepy    Dispense:  20 tablet    Refill:  0    Only fill this if the ondansetron (Zofran) is not covered; do not fill both   An after-visit summary was printed and given to the patient at check-out.  Please see the patient instructions which may contain other information and recommendations beyond what is mentioned above  in the assessment and  plan.

## 2015-12-04 NOTE — Assessment & Plan Note (Signed)
May be an absorption issue; he is going to have EGD and colonoscopy next week; expect biopsies will likely be done and I so appreciate the help of the

## 2015-12-04 NOTE — Assessment & Plan Note (Signed)
Will start anti-emetic; he will f/u with GI specialist and undergo EGD and colonoscopy on Monday; food restrictions

## 2015-12-05 NOTE — Discharge Instructions (Signed)

## 2015-12-09 ENCOUNTER — Ambulatory Visit
Admission: RE | Admit: 2015-12-09 | Discharge: 2015-12-09 | Disposition: A | Discharge status: Home / Self Care | Payer: Commercial Managed Care - HMO | Source: Ambulatory Visit | Attending: Gastroenterology | Admitting: Gastroenterology

## 2015-12-09 ENCOUNTER — Encounter
Admission: RE | Disposition: A | Discharge status: Home / Self Care | Payer: Self-pay | Source: Ambulatory Visit | Attending: Gastroenterology

## 2015-12-09 ENCOUNTER — Ambulatory Visit: Payer: Commercial Managed Care - HMO | Admitting: Anesthesiology

## 2015-12-09 ENCOUNTER — Other Ambulatory Visit: Payer: Self-pay | Admitting: Gastroenterology

## 2015-12-09 DIAGNOSIS — K2950 Unspecified chronic gastritis without bleeding: Secondary | ICD-10-CM | POA: Insufficient documentation

## 2015-12-09 DIAGNOSIS — R634 Abnormal weight loss: Secondary | ICD-10-CM | POA: Insufficient documentation

## 2015-12-09 DIAGNOSIS — K219 Gastro-esophageal reflux disease without esophagitis: Secondary | ICD-10-CM | POA: Diagnosis not present

## 2015-12-09 DIAGNOSIS — K529 Noninfective gastroenteritis and colitis, unspecified: Secondary | ICD-10-CM | POA: Diagnosis not present

## 2015-12-09 DIAGNOSIS — Z8614 Personal history of Methicillin resistant Staphylococcus aureus infection: Secondary | ICD-10-CM | POA: Diagnosis not present

## 2015-12-09 DIAGNOSIS — R112 Nausea with vomiting, unspecified: Secondary | ICD-10-CM | POA: Insufficient documentation

## 2015-12-09 DIAGNOSIS — F5089 Other specified eating disorder: Secondary | ICD-10-CM | POA: Diagnosis not present

## 2015-12-09 DIAGNOSIS — K449 Diaphragmatic hernia without obstruction or gangrene: Secondary | ICD-10-CM | POA: Insufficient documentation

## 2015-12-09 DIAGNOSIS — R197 Diarrhea, unspecified: Secondary | ICD-10-CM | POA: Diagnosis not present

## 2015-12-09 DIAGNOSIS — Z79899 Other long term (current) drug therapy: Secondary | ICD-10-CM | POA: Insufficient documentation

## 2015-12-09 DIAGNOSIS — F17210 Nicotine dependence, cigarettes, uncomplicated: Secondary | ICD-10-CM | POA: Insufficient documentation

## 2015-12-09 HISTORY — DX: Reserved for inherently not codable concepts without codable children: IMO0001

## 2015-12-09 HISTORY — PX: ESOPHAGOGASTRODUODENOSCOPY (EGD) WITH PROPOFOL: SHX5813

## 2015-12-09 HISTORY — PX: COLONOSCOPY WITH PROPOFOL: SHX5780

## 2015-12-09 SURGERY — COLONOSCOPY WITH PROPOFOL
Anesthesia: Monitor Anesthesia Care

## 2015-12-09 MED ORDER — LACTATED RINGERS IV SOLN
INTRAVENOUS | Status: DC
  Administered 2015-12-09: 10:00:00 via INTRAVENOUS

## 2015-12-09 MED ORDER — GLYCOPYRROLATE 0.2 MG/ML IJ SOLN
INTRAMUSCULAR | Status: DC | PRN
  Administered 2015-12-09: 0.2 mg via INTRAVENOUS

## 2015-12-09 MED ORDER — SODIUM CHLORIDE 0.9 % IV SOLN: INTRAVENOUS | Status: DC

## 2015-12-09 MED ORDER — PROPOFOL 10 MG/ML IV BOLUS
INTRAVENOUS | Status: DC | PRN
  Administered 2015-12-09 (×9): 20 mg via INTRAVENOUS

## 2015-12-09 MED ORDER — STERILE WATER FOR IRRIGATION IR SOLN
Status: DC | PRN
  Administered 2015-12-09: 10:00:00

## 2015-12-09 SURGICAL SUPPLY — 39 items

## 2015-12-09 NOTE — Anesthesia Procedure Notes (Signed)
Procedure Name: MAC Performed by: Shahzain Kiester Pre-anesthesia Checklist: Patient identified, Emergency Drugs available, Suction available, Patient being monitored and Timeout performed Patient Re-evaluated:Patient Re-evaluated prior to inductionOxygen Delivery Method: Nasal cannula Preoxygenation: Pre-oxygenation with 100% oxygen     

## 2015-12-09 NOTE — Op Note (Signed)
[facility] Gastroenterology Patient Name: Squire Withey Procedure Date: 12/09/2015 9:52 AM MRN: [id number] Account #: [id number] Date of Birth: 1991-08-08 Admit Type: Outpatient Age: 25 Room: [facility] OR ROOM 01 Gender: Male Note Status: Finalized Procedure:         Upper GI endoscopy Indications:       Diarrhea, Nausea with vomiting, Weight loss Providers:         Midge Minium, MD Referring MD:      Kerman Passey (Referring MD) Medicines:         Propofol per Anesthesia Complications:     No immediate complications. Procedure:         Pre-Anesthesia Assessment:                    - Prior to the procedure, a History and Physical was                     performed, and patient medications and allergies were                     reviewed. The patient's tolerance of previous anesthesia                     was also reviewed. The risks and benefits of the procedure                     and the sedation options and risks were discussed with the                     patient. All questions were answered, and informed consent                     was obtained. Prior Anticoagulants: The patient has taken                     no previous anticoagulant or antiplatelet agents. ASA                     Grade Assessment: II - A patient with mild systemic                     disease. After reviewing the risks and benefits, the                     patient was deemed in satisfactory condition to undergo                     the procedure.                    After obtaining informed consent, the endoscope was passed                     under direct vision. Throughout the procedure, the                     patient's blood pressure, pulse, and oxygen saturations                     were monitored continuously. The Olympus GIF-HQ190                     Endoscope (S#. [id number]) was introduced through the mouth,  and advanced to the second part of duodenum. The upper GI               endoscopy was accomplished without difficulty. The patient                     tolerated the procedure well. Findings:      A small hiatus hernia was present.      Localized mild inflammation characterized by erythema was found in the       gastric antrum. Biopsies were taken with a cold forceps for histology.      The examined duodenum was normal. Biopsies were taken with a cold       forceps for histology. Impression:        - Small hiatus hernia.                    - Gastritis. Biopsied.                    - Normal examined duodenum. Biopsied. Recommendation:    - Perform a colonoscopy today. Procedure Code(s): --- Professional ---                    [phone], Esophagogastroduodenoscopy, flexible, transoral;                     with biopsy, single or multiple Diagnosis Code(s): --- Professional ---                    R19.7, Diarrhea, unspecified                    R11.2, Nausea with vomiting, unspecified                    R63.4, Abnormal weight loss                    K29.70, Gastritis, unspecified, without bleeding                    K44.9, Diaphragmatic hernia without obstruction or gangrene CPT copyright 2014 American Medical Association. All rights reserved. The codes documented in this report are preliminary and upon coder review may  be revised to meet current compliance requirements. Midge Minium, MD 12/09/2015 10:23:36 AM This report has been signed electronically. Number of Addenda: 0 Note Initiated On: 12/09/2015 9:52 AM Total Procedure Duration: 0 hours 6 minutes 22 seconds       [facility]

## 2015-12-09 NOTE — H&P (Signed)
  [facility]  [address] [place], [place] [address] Phone: [phone] Fax : [phone]  Primary Care Physician:  Baruch Gouty, MD Primary Gastroenterologist:  Dr. Servando Snare  Pre-Procedure History & Physical: HPI:  Marcus Hill is a 25 y.o. male is here for an endoscopy and colonoscopy.   Past Medical History  Diagnosis Date  . MRSA infection   . GERD (gastroesophageal reflux disease)   . Cold     History reviewed. No pertinent past surgical history.  Prior to Admission medications   Medication Sig Start Date End Date Taking? Authorizing Provider  Multiple Vitamins-Minerals (HM MULTIVITAMIN ADULT GUMMY PO) Take by mouth.   Yes Historical Provider, MD  omeprazole (PRILOSEC) 40 MG capsule Take 1 capsule (40 mg total) by mouth daily. 10/31/15  Yes Kerman Passey, MD  ondansetron (ZOFRAN) 4 MG tablet Take 1 tablet (4 mg total) by mouth every 8 (eight) hours as needed for nausea or vomiting. Patient not taking: Reported on 12/09/2015 12/04/15   Kerman Passey, MD  promethazine (PHENERGAN) 25 MG tablet Take 1 tablet (25 mg total) by mouth every 6 (six) hours as needed for nausea or vomiting. Do NOT take with ondansetron (Zofran); this can make you sleepy Patient not taking: Reported on 12/09/2015 12/04/15   Kerman Passey, MD    Allergies as of 11/21/2015  . (No Known Allergies)    Family History  Problem Relation Age of Onset  . Hypertension Mother   . Hyperlipidemia Mother   . Diabetes Father   . Heart disease Father   . Heart attack Father   . Diabetes Maternal Grandmother   . Diabetes Maternal Grandfather   . Diabetes Paternal Grandmother   . Diabetes Paternal Grandfather   . Cancer Neg Hx   . COPD Neg Hx   . Stroke Neg Hx     Social History   Social History  . Marital Status: Single    Spouse Name: N/A  . Number of Children: N/A  . Years of Education: N/A   Occupational History  . Not on file.   Social History Main Topics  . Smoking  status: Current Every Day Smoker -- 0.50 packs/day for 4 years    Types: Cigarettes  . Smokeless tobacco: Never Used  . Alcohol Use: No  . Drug Use: Yes    Special: Marijuana  . Sexual Activity: Not on file   Other Topics Concern  . Not on file   Social History Narrative    Review of Systems: See HPI, otherwise negative ROS  Physical Exam: BP 124/74 mmHg  Pulse 76  Temp(Src) 98.1 F (36.7 C) (Temporal)  Resp 16  Ht  (1.727 m)  Wt 180 lb (81.647 kg)  BMI 27.38 kg/m2  SpO2 99% General:   Alert,  pleasant and cooperative in NAD Head:  Normocephalic and atraumatic. Neck:  Supple; no masses or thyromegaly. Lungs:  Clear throughout to auscultation.    Heart:  Regular rate and rhythm. Abdomen:  Soft, nontender and nondistended. Normal bowel sounds, without guarding, and without rebound.   Neurologic:  Alert and  oriented x4;  grossly normal neurologically.  Impression/Plan: Marcus Hill is here for an endoscopy and colonoscopy to be performed for diarrhea and weight loss with vomiing.  Risks, benefits, limitations, and alternatives regarding  endoscopy and colonoscopy have been reviewed with the patient.  Questions have been answered.  All parties agreeable.   Darlina Rumpf, MD  12/09/2015, 9:54 AM

## 2015-12-09 NOTE — Op Note (Signed)
[facility] Gastroenterology Patient Name: Marcus Hill Procedure Date: 12/09/2015 9:52 AM MRN: [id number] Account #: [id number] Date of Birth: 07-13-1991 Admit Type: Outpatient Age: 25 Room: [facility] OR ROOM 01 Gender: Male Note Status: Finalized Procedure:         Colonoscopy Indications:       Chronic diarrhea, Weight loss Providers:         Midge Minium, MD Medicines:         Propofol per Anesthesia Complications:     No immediate complications. Procedure:         Pre-Anesthesia Assessment:                    - Prior to the procedure, a History and Physical was                     performed, and patient medications and allergies were                     reviewed. The patient's tolerance of previous anesthesia                     was also reviewed. The risks and benefits of the procedure                     and the sedation options and risks were discussed with the                     patient. All questions were answered, and informed consent                     was obtained. Prior Anticoagulants: The patient has taken                     no previous anticoagulant or antiplatelet agents. ASA                     Grade Assessment: II - A patient with mild systemic                     disease. After reviewing the risks and benefits, the                     patient was deemed in satisfactory condition to undergo                     the procedure.                    After obtaining informed consent, the colonoscope was                     passed under direct vision. Throughout the procedure, the                     patient's blood pressure, pulse, and oxygen saturations                     were monitored continuously. The Olympus CF H180AL                     colonoscope (S#: [id number]) was introduced through the anus                     and advanced to the the terminal ileum. The colonoscopy  was performed without difficulty. The patient tolerated                    the procedure well. The quality of the bowel preparation                     was fair. Findings:      The perianal and digital rectal examinations were normal.      The colon (entire examined portion) appeared normal. Random biopsies       were obtained in the entire colon with cold forceps for histology.      The terminal ileum appeared normal. This was biopsied with a cold       forceps for histology. Impression:        - The entire examined colon is normal.                    - The examined portion of the ileum was normal. Biopsied.                    - Random biopsies were obtained in the entire colon. Recommendation:    - Await pathology results. Procedure Code(s): --- Professional ---                    [phone], Colonoscopy, flexible; with biopsy, single or                     multiple Diagnosis Code(s): --- Professional ---                    R63.4, Abnormal weight loss                    K52.9, Noninfective gastroenteritis and colitis,                     unspecified CPT copyright 2014 American Medical Association. All rights reserved. The codes documented in this report are preliminary and upon coder review may  be revised to meet current compliance requirements. Midge Minium, MD 12/09/2015 10:34:56 AM This report has been signed electronically. Number of Addenda: 0 Note Initiated On: 12/09/2015 9:52 AM Scope Withdrawal Time: 0 hours 5 minutes 46 seconds  Total Procedure Duration: 0 hours 7 minutes 44 seconds       [facility]

## 2015-12-09 NOTE — Transfer of Care (Signed)
Immediate Anesthesia Transfer of Care Note  Patient: Marcus Hill  Procedure(s) Performed: Procedure(s): COLONOSCOPY WITH PROPOFOL (N/A) ESOPHAGOGASTRODUODENOSCOPY (EGD) WITH PROPOFOL (N/A)  Patient Location: PACU  Anesthesia Type: MAC  Level of Consciousness: awake, alert  and patient cooperative  Airway and Oxygen Therapy: Patient Spontanous Breathing and Patient connected to supplemental oxygen  Post-op Assessment: Post-op Vital signs reviewed, Patient's Cardiovascular Status Stable, Respiratory Function Stable, Patent Airway and No signs of Nausea or vomiting  Post-op Vital Signs: Reviewed and stable  Complications: No apparent anesthesia complications

## 2015-12-09 NOTE — Anesthesia Preprocedure Evaluation (Signed)
Anesthesia Evaluation  Patient identified by MRN, date of birth, ID band  Reviewed: Allergy & Precautions, H&P , NPO status , Patient's Chart, lab work & pertinent test results  Airway Mallampati: II  TM Distance: >3 FB Neck ROM: full    Dental no notable dental hx.    Pulmonary Current Smoker,    Pulmonary exam normal        Cardiovascular  Rhythm:regular Rate:Normal     Neuro/Psych    GI/Hepatic GERD  ,  Endo/Other    Renal/GU      Musculoskeletal   Abdominal   Peds  Hematology   Anesthesia Other Findings Tongue piercing   Reproductive/Obstetrics                             Anesthesia Physical Anesthesia Plan  ASA: II  Anesthesia Plan: MAC   Post-op Pain Management:    Induction:   Airway Management Planned:   Additional Equipment:   Intra-op Plan:   Post-operative Plan:   Informed Consent: I have reviewed the patients History and Physical, chart, labs and discussed the procedure including the risks, benefits and alternatives for the proposed anesthesia with the patient or authorized representative who has indicated his/her understanding and acceptance.     Plan Discussed with: CRNA  Anesthesia Plan Comments:         Anesthesia Quick Evaluation

## 2015-12-09 NOTE — Anesthesia Postprocedure Evaluation (Signed)
Anesthesia Post Note  Patient: Marcus Hill  Procedure(s) Performed: Procedure(s) (LRB): COLONOSCOPY WITH PROPOFOL (N/A) ESOPHAGOGASTRODUODENOSCOPY (EGD) WITH PROPOFOL (N/A)  Patient location during evaluation: PACU Anesthesia Type: MAC Level of consciousness: awake and alert and oriented Pain management: satisfactory to patient Vital Signs Assessment: post-procedure vital signs reviewed and stable Respiratory status: spontaneous breathing, nonlabored ventilation and respiratory function stable Cardiovascular status: blood pressure returned to baseline and stable Postop Assessment: Adequate PO intake and No signs of nausea or vomiting Anesthetic complications: no    Cherly [place]

## 2015-12-10 ENCOUNTER — Encounter: Payer: Self-pay | Admitting: Gastroenterology

## 2015-12-11 ENCOUNTER — Telehealth: Payer: Self-pay

## 2015-12-11 NOTE — Telephone Encounter (Signed)
Pt called requesting a work note for colonoscopy that was on 12/09/15.

## 2015-12-13 ENCOUNTER — Encounter: Payer: Self-pay | Admitting: Gastroenterology

## 2015-12-20 ENCOUNTER — Telehealth: Payer: Self-pay

## 2015-12-20 NOTE — Telephone Encounter (Signed)
-----   Message from Midge Minium, MD sent at 12/19/2015  1:19 PM EST ----- Let the patient know that all the biopsies taken showed normal tissue without any cause for his symptoms. If he is still having problems please have him follow-up in the office.

## 2015-12-20 NOTE — Telephone Encounter (Signed)
Tried contacting pt but vm had not been set it.

## 2015-12-23 ENCOUNTER — Encounter: Payer: Self-pay | Admitting: *Deleted

## 2015-12-23 ENCOUNTER — Emergency Department
Admission: EM | Admit: 2015-12-23 | Discharge: 2015-12-23 | Disposition: A | Discharge status: Home / Self Care | Payer: 59 | Attending: Emergency Medicine | Admitting: Emergency Medicine

## 2015-12-23 DIAGNOSIS — R110 Nausea: Secondary | ICD-10-CM | POA: Diagnosis present

## 2015-12-23 DIAGNOSIS — F17210 Nicotine dependence, cigarettes, uncomplicated: Secondary | ICD-10-CM | POA: Insufficient documentation

## 2015-12-23 DIAGNOSIS — Z79899 Other long term (current) drug therapy: Secondary | ICD-10-CM | POA: Insufficient documentation

## 2015-12-23 DIAGNOSIS — K219 Gastro-esophageal reflux disease without esophagitis: Secondary | ICD-10-CM | POA: Diagnosis not present

## 2015-12-23 MED ORDER — OMEPRAZOLE 40 MG PO CPDR: 40.0000 mg | DELAYED_RELEASE_CAPSULE | Freq: Every day | ORAL | Status: DC

## 2015-12-23 NOTE — ED Notes (Signed)
I have a primary physician states patient. States he has an appt. This Wed., has acid reflux and wants a work note.

## 2015-12-23 NOTE — ED Notes (Signed)
Pt complains of nausea, pt has appointment with PCP for a known hernia, pt is here for a work note until he is able to see MD

## 2015-12-23 NOTE — Discharge Instructions (Signed)

## 2015-12-23 NOTE — ED Provider Notes (Signed)
[facility] Emergency Department Provider Note  ____________________________________________  Time seen: Approximately 10:15 AM  I have reviewed the triage vital signs and the nursing notes.   HISTORY  Chief Complaint Nausea    HPI Marcus Hill is a 25 y.o. male who presents emergency department complaining of increased and acid reflux symptoms. Patient states that he has a diaphragmatic hernia and has had GERD-like symptoms. Patient is on omeprazole for symptoms and states that he ran out of medication. He has a primary care provider appointment in 2 days but states that he had to call her work due to GERD symptoms to include nausea and vomiting.   Past Medical History  Diagnosis Date  . MRSA infection   . GERD (gastroesophageal reflux disease)   . Cold     Patient Active Problem List   Diagnosis Date Noted  . Loss of weight   . Diarrhea   . Psychogenic vomiting with nausea   . Nausea and vomiting 12/04/2015  . Left upper quadrant pain 10/31/2015  . Heartburn 10/31/2015  . Pain, dental 10/31/2015  . Chronic diarrhea 10/17/2015  . Abnormal weight loss 10/17/2015  . Tobacco abuse 10/17/2015    Past Surgical History  Procedure Laterality Date  . Colonoscopy with propofol N/A 12/09/2015    Procedure: COLONOSCOPY WITH PROPOFOL;  Surgeon: Midge Minium, MD;  Location: [facility];  Service: Endoscopy;  Laterality: N/A;  . Esophagogastroduodenoscopy (egd) with propofol N/A 12/09/2015    Procedure: ESOPHAGOGASTRODUODENOSCOPY (EGD) WITH PROPOFOL;  Surgeon: Midge Minium, MD;  Location: [facility];  Service: Endoscopy;  Laterality: N/A;    Current Outpatient Rx  Name  Route  Sig  Dispense  Refill  . Multiple Vitamins-Minerals (HM MULTIVITAMIN ADULT GUMMY PO)   Oral   Take by mouth.         Marland Kitchen omeprazole (PRILOSEC) 40 MG capsule   Oral   Take 1 capsule (40 mg total) by mouth daily.   30 capsule   1   . omeprazole (PRILOSEC) 40  MG capsule   Oral   Take 1 capsule (40 mg total) by mouth daily.   30 capsule   0   . ondansetron (ZOFRAN) 4 MG tablet   Oral   Take 1 tablet (4 mg total) by mouth every 8 (eight) hours as needed for nausea or vomiting. Patient not taking: Reported on 12/09/2015   20 tablet   0     *Please call me for phenergan Rx if insurance does ...   . promethazine (PHENERGAN) 25 MG tablet   Oral   Take 1 tablet (25 mg total) by mouth every 6 (six) hours as needed for nausea or vomiting. Do NOT take with ondansetron (Zofran); this can make you sleepy Patient not taking: Reported on 12/09/2015   20 tablet   0     Only fill this if the ondansetron (Zofran) is not  ...     Allergies Review of patient's allergies indicates no known allergies.  Family History  Problem Relation Age of Onset  . Hypertension Mother   . Hyperlipidemia Mother   . Diabetes Father   . Heart disease Father   . Heart attack Father   . Diabetes Maternal Grandmother   . Diabetes Maternal Grandfather   . Diabetes Paternal Grandmother   . Diabetes Paternal Grandfather   . Cancer Neg Hx   . COPD Neg Hx   . Stroke Neg Hx     Social History Social History  Substance Use Topics  . Smoking status: Current Every Day Smoker -- 0.50 packs/day for 4 years    Types: Cigarettes  . Smokeless tobacco: Never Used  . Alcohol Use: No     Review of Systems  Constitutional: No fever/chills Cardiovascular: no chest pain. Respiratory: no cough. No SOB. Gastrointestinal: No abdominal pain.  Positive for reflux. Positive for nausea and vomiting.  No diarrhea.  No constipation. Musculoskeletal: Negative for back pain. Skin: Negative for rash. Neurological: Negative for headaches, focal weakness or numbness. 10-point ROS otherwise negative.  ____________________________________________   PHYSICAL EXAM:  VITAL SIGNS: ED Triage Vitals  Enc Vitals Group     BP 12/23/15 0915 142/93 mmHg     Pulse Rate 12/23/15 0915 78      Resp 12/23/15 0915 20     Temp 12/23/15 0915 98.1 F (36.7 C)     Temp Source 12/23/15 0915 Oral     SpO2 12/23/15 0915 100 %     Weight 12/23/15 0915 180 lb (81.647 kg)     Height 12/23/15 0915  (1.727 m)     Head Cir --      Peak Flow --      Pain Score --      Pain Loc --      Pain Edu? --      Excl. in GC? --      Constitutional: Alert and oriented. Well appearing and in no acute distress. Eyes: Conjunctivae are normal. PERRL. EOMI. Head: Atraumatic. Hematological/Lymphatic/Immunilogical: No cervical lymphadenopathy. Cardiovascular: Normal rate, regular rhythm. Normal S1 and S2.  Good peripheral circulation. Respiratory: Normal respiratory effort without tachypnea or retractions. Lungs CTAB. Gastrointestinal: Sounds 4 quadrants. Soft and nontender. No guarding or rigidity. No distention. No CVA tenderness. Musculoskeletal: No lower extremity tenderness nor edema.  No joint effusions. Neurologic:  Normal speech and language. No gross focal neurologic deficits are appreciated.  Skin:  Skin is warm, dry and intact. No rash noted. Psychiatric: Mood and affect are normal. Speech and behavior are normal. Patient exhibits appropriate insight and judgement.   ____________________________________________   LABS (all labs ordered are listed, but only abnormal results are displayed)  Labs Reviewed - No data to display ____________________________________________  EKG   ____________________________________________  RADIOLOGY   No results found.  ____________________________________________    PROCEDURES  Procedure(s) performed:       Medications - No data to display   ____________________________________________   INITIAL IMPRESSION / ASSESSMENT AND PLAN / ED COURSE  Pertinent labs & imaging results that were available during my care of the patient were reviewed by me and considered in my medical decision making (see chart for details).  Patient's  diagnosis is consistent with GERD. Patient has a known diagnosis of GERD from his diaphragmatic hernia. He is on omeprazole 40 mg daily. Patient ran out of his medications and is scheduled to see a PCP on 12/25/2015. Patient states that he has had some nausea and vomiting from his GERD symptoms today. Patient states that he does not need antiemetics as long as he has his omeprazole. Patient's prescription for omeprazole is refilled here in the emergency department. Patient will keep his follow-up primary care appointment in 2 days.. Patient is given ED precautions to return to the ED for any worsening or new symptoms.     ____________________________________________  FINAL CLINICAL IMPRESSION(S) / ED DIAGNOSES  Final diagnoses:  Gastroesophageal reflux disease, esophagitis presence not specified      NEW MEDICATIONS STARTED DURING THIS  VISIT:  New Prescriptions   OMEPRAZOLE (PRILOSEC) 40 MG CAPSULE    Take 1 capsule (40 mg total) by mouth daily.        Racheal Patches, PA-C 12/23/15 1026  Darci Current, MD 12/24/15 1524

## 2015-12-25 ENCOUNTER — Ambulatory Visit (INDEPENDENT_AMBULATORY_CARE_PROVIDER_SITE_OTHER): Payer: 59 | Admitting: Family Medicine

## 2015-12-25 ENCOUNTER — Encounter: Payer: Self-pay | Admitting: Family Medicine

## 2015-12-25 VITALS — BP 106/66 | HR 57 | Temp 98.2°F | Ht 66.5 in | Wt 177.6 lb

## 2015-12-25 DIAGNOSIS — K449 Diaphragmatic hernia without obstruction or gangrene: Secondary | ICD-10-CM | POA: Diagnosis not present

## 2015-12-25 DIAGNOSIS — R12 Heartburn: Secondary | ICD-10-CM

## 2015-12-25 DIAGNOSIS — K529 Noninfective gastroenteritis and colitis, unspecified: Secondary | ICD-10-CM

## 2015-12-25 MED ORDER — OMEPRAZOLE 40 MG PO CPDR: 40.0000 mg | DELAYED_RELEASE_CAPSULE | Freq: Every day | ORAL | Status: DC

## 2015-12-25 NOTE — Patient Instructions (Addendum)
Try the low FODMAP diet Do call Dr. Servando Snare and schedule a follow-up with him Avoid spicy and greasy foods Elevate the head of your bed a little bit Call with any issues GINA --> out of work for this appointment I'll be here through March 31st and then will start at [facility] April 3rd

## 2015-12-25 NOTE — Progress Notes (Signed)
BP 106/66 mmHg  Pulse 57  Temp(Src) 98.2 F (36.8 C)  Ht 5' 6.5" (1.689 m)  Wt 177 lb 9.6 oz (80.559 kg)  BMI 28.24 kg/m2  SpO2 98%   Subjective:    Patient ID: Marcus Hill, male    DOB: 06/24/1991, 25 y.o.   MRN: [id number]  HPI: Marcus Hill is a 25 y.o. male  Chief Complaint  Patient presents with  . Follow-up  . Gastroesophageal Reflux    Was in ER 12/23/2015--patient was out of medication and was getting sick.   Marland Kitchen GI Problem    Patient was seen at [facility] and they found a hernia patient stated.   He had the EGD and colonscopy already; we reviewed those reports in detail Stools are still runny; no mucous He just needs refill of PPI  Relevant past medical, surgical history reviewed and updated as indicated. Interim medical history since our last visit reviewed. Allergies and medications reviewed by MD  Review of Systems Per HPI unless specifically indicated above     Objective:    BP 106/66 mmHg  Pulse 57  Temp(Src) 98.2 F (36.8 C)  Ht 5' 6.5" (1.689 m)  Wt 177 lb 9.6 oz (80.559 kg)  BMI 28.24 kg/m2  SpO2 98%  Wt Readings from Last 3 Encounters:  12/25/15 177 lb 9.6 oz (80.559 kg)  12/23/15 180 lb (81.647 kg)  12/09/15 180 lb (81.647 kg)    Physical Exam  Constitutional: He appears well-developed and well-nourished.  HENT:  Mouth/Throat: Mucous membranes are normal.  Cardiovascular: Regular rhythm.  Bradycardia present.   Pulmonary/Chest: Effort normal and breath sounds normal.  Abdominal: He exhibits no distension. There is no tenderness.  Skin: No pallor.  Psychiatric: He has a normal mood and affect.   Results for orders placed or performed in visit on 10/17/15  Stool Culture  Result Value Ref Range   Campylobacter Culture Final report    RESULT 1 Comment   Giardia/Cryptosporidium EIA  Result Value Ref Range   Giardia Ag, Stl Negative Negative   Cryptosporidium EIA Negative Negative  Ova and parasite examination  Result Value  Ref Range   OVA + PARASITE EXAM Final report    O&P result 1 Comment   ANA w/Reflex if Positive  Result Value Ref Range   Anit Nuclear Antibody(ANA) Negative Negative  Gliadin antibodies, serum  Result Value Ref Range   Antigliadin Abs, IgA 4 0 - 19 units   Gliadin IgG 3 0 - 19 units  Tissue transglutaminase, IgA  Result Value Ref Range   Transglutaminase IgA <2 0 - 3 U/mL  Reticulin Antibody, IgA w reflex titer  Result Value Ref Range   Reticulin Ab, IgA Negative Neg:<1:2.5 titer  TSH  Result Value Ref Range   TSH 0.535 0.450 - 4.500 uIU/mL  C-reactive protein  Result Value Ref Range   CRP 1.4 0.0 - 4.9 mg/L  Comprehensive metabolic panel  Result Value Ref Range   Glucose 78 65 - 99 mg/dL   BUN 10 6 - 20 mg/dL   Creatinine, Ser 5.62 0.76 - 1.27 mg/dL   GFR calc non Af Amer 122 >59 mL/min/1.73   GFR calc Af Amer 141 >59 mL/min/1.73   BUN/Creatinine Ratio 11 8 - 19   Sodium 144 134 - 144 mmol/L   Potassium 4.5 3.5 - 5.2 mmol/L   Chloride 103 96 - 106 mmol/L   CO2 25 18 - 29 mmol/L   Calcium 9.5 8.7 - 10.2  mg/dL   Total Protein 6.7 6.0 - 8.5 g/dL   Albumin 4.7 3.5 - 5.5 g/dL   Globulin, Total 2.0 1.5 - 4.5 g/dL   Albumin/Globulin Ratio 2.4 1.1 - 2.5   Bilirubin Total 0.3 0.0 - 1.2 mg/dL   Alkaline Phosphatase 64 39 - 117 IU/L   AST 12 0 - 40 IU/L   ALT 15 0 - 44 IU/L  CBC with Differential/Platelet  Result Value Ref Range   WBC 8.1 3.4 - 10.8 x10E3/uL   RBC 4.77 4.14 - 5.80 x10E6/uL   Hemoglobin 14.6 12.6 - 17.7 g/dL   Hematocrit 69.6 29.5 - 51.0 %   MCV 90 79 - 97 fL   MCH 30.6 26.6 - 33.0 pg   MCHC 33.9 31.5 - 35.7 g/dL   RDW 28.4 13.2 - 44.0 %   Platelets 214 150 - 379 x10E3/uL   Neutrophils 58 %   Lymphs 31 %   Monocytes 7 %   Eos 4 %   Basos 0 %   Neutrophils Absolute 4.7 1.4 - 7.0 x10E3/uL   Lymphocytes Absolute 2.5 0.7 - 3.1 x10E3/uL   Monocytes Absolute 0.5 0.1 - 0.9 x10E3/uL   EOS (ABSOLUTE) 0.3 0.0 - 0.4 x10E3/uL   Basophils Absolute 0.0 0.0 -  0.2 x10E3/uL   Immature Granulocytes 0 %   Immature Grans (Abs) 0.0 0.0 - 0.1 x10E3/uL      Assessment & Plan:   Problem List Items Addressed This Visit      Respiratory   Hiatal hernia    Discovered on endoscopy; elevated HOB        Digestive   Chronic diarrhea - Primary    Under the care of GI; recommended low FODMAP diet to see if that helps at all        Other   Heartburn    Refills provided of medicine; f/u with GI as recommended         Follow up plan: No Follow-up on file. PRN  An after-visit summary was printed and given to the patient at check-out.  Please see the patient instructions which may contain other information and recommendations beyond what is mentioned above in the assessment and plan.  Meds ordered this encounter  Medications  . DISCONTD: omeprazole (PRILOSEC) 40 MG capsule    Sig: Take 1 capsule by mouth daily.  Marland Kitchen omeprazole (PRILOSEC) 40 MG capsule    Sig: Take 1 capsule (40 mg total) by mouth daily.    Dispense:  30 capsule    Refill:  5

## 2015-12-27 ENCOUNTER — Telehealth: Payer: Self-pay

## 2015-12-27 NOTE — Telephone Encounter (Signed)
Tried contacting pt a couple of times to give results. Pt's vm was not set up and I have not been able to leave a message. Printed results and mailed pt a letter. Advised him to contact our office to schedule a follow up if still having problems.

## 2015-12-27 NOTE — Telephone Encounter (Signed)
Tried contacting pt again today. No vm available to leave a message. Will print results and mail to pt.

## 2015-12-27 NOTE — Telephone Encounter (Signed)
-----   Message from Darren Wohl, MD sent at 12/19/2015  1:19 PM EST ----- Let the patient know that all the biopsies taken showed normal tissue without any cause for his symptoms. If he is still having problems please have him follow-up in the office. 

## 2016-01-07 DIAGNOSIS — K449 Diaphragmatic hernia without obstruction or gangrene: Secondary | ICD-10-CM | POA: Insufficient documentation

## 2016-01-07 NOTE — Assessment & Plan Note (Signed)
Under the care of GI; recommended low FODMAP diet to see if that helps at all

## 2016-01-07 NOTE — Assessment & Plan Note (Signed)
Refills provided of medicine; f/u with GI as recommended

## 2016-01-07 NOTE — Assessment & Plan Note (Signed)
Discovered on endoscopy; elevated HOB

## 2016-01-15 ENCOUNTER — Emergency Department
Admission: EM | Admit: 2016-01-15 | Discharge: 2016-01-15 | Disposition: A | Discharge status: Home / Self Care | Payer: 59 | Attending: Emergency Medicine | Admitting: Emergency Medicine

## 2016-01-15 ENCOUNTER — Encounter: Payer: Self-pay | Admitting: Medical Oncology

## 2016-01-15 DIAGNOSIS — R112 Nausea with vomiting, unspecified: Secondary | ICD-10-CM | POA: Insufficient documentation

## 2016-01-15 DIAGNOSIS — Z79899 Other long term (current) drug therapy: Secondary | ICD-10-CM | POA: Insufficient documentation

## 2016-01-15 DIAGNOSIS — F1299 Cannabis use, unspecified with unspecified cannabis-induced disorder: Secondary | ICD-10-CM | POA: Diagnosis not present

## 2016-01-15 DIAGNOSIS — J111 Influenza due to unidentified influenza virus with other respiratory manifestations: Secondary | ICD-10-CM | POA: Diagnosis present

## 2016-01-15 DIAGNOSIS — K219 Gastro-esophageal reflux disease without esophagitis: Secondary | ICD-10-CM | POA: Insufficient documentation

## 2016-01-15 DIAGNOSIS — A084 Viral intestinal infection, unspecified: Secondary | ICD-10-CM | POA: Diagnosis not present

## 2016-01-15 DIAGNOSIS — F17210 Nicotine dependence, cigarettes, uncomplicated: Secondary | ICD-10-CM | POA: Diagnosis not present

## 2016-01-15 MED ORDER — ONDANSETRON 4 MG PO TBDP: 4.0000 mg | ORAL_TABLET | Freq: Three times a day (TID) | ORAL | Status: DC | PRN

## 2016-01-15 NOTE — Discharge Instructions (Signed)
Viral Gastroenteritis Viral gastroenteritis is also called stomach flu. This illness is caused by a certain type of germ (virus). It can cause sudden watery poop (diarrhea) and throwing up (vomiting). This can cause you to lose body fluids (dehydration). This illness usually lasts for 3 to 8 days. It usually goes away on its own. HOME CARE   Drink enough fluids to keep your pee (urine) clear or pale yellow. Drink small amounts of fluids often.  Ask your doctor how to replace body fluid losses (rehydration).  Avoid:  Foods high in sugar.  Alcohol.  Bubbly (carbonated) drinks.  Tobacco.  Juice.  Caffeine drinks.  Very hot or cold fluids.  Fatty, greasy foods.  Eating too much at one time.  Dairy products until 24 to 48 hours after your watery poop stops.  You may eat foods with active cultures (probiotics). They can be found in some yogurts and supplements.  Wash your hands well to avoid spreading the illness.  Only take medicines as told by your doctor. Do not give aspirin to children. Do not take medicines for watery poop (antidiarrheals).  Ask your doctor if you should keep taking your regular medicines.  Keep all doctor visits as told. GET HELP RIGHT AWAY IF:   You cannot keep fluids down.  You do not pee at least once every 6 to 8 hours.  You are short of breath.  You see blood in your poop or throw up. This may look like coffee grounds.  You have belly (abdominal) pain that gets worse or is just in one small spot (localized).  You keep throwing up or having watery poop.  You have a fever.  The patient is a child younger than 3 months, and he or she has a fever.  The patient is a child older than 3 months, and he or she has a fever and problems that do not go away.  The patient is a child older than 3 months, and he or she has a fever and problems that suddenly get worse.  The patient is a baby, and he or she has no tears when crying. MAKE SURE YOU:     Understand these instructions.  Will watch your condition.  Will get help right away if you are not doing well or get worse.   This information is not intended to replace advice given to you by your health care provider. Make sure you discuss any questions you have with your health care provider.   Document Released: 04/06/2008 Document Revised: 01/11/2012 Document Reviewed: 08/05/2011 Elsevier Interactive Patient Education 2016 [employer]    Clear liquids for the next 24-36 hours. Zofran as needed for nausea. You may take Tylenol as needed for headache. Follow-up with your primary care doctor if any continued problems.

## 2016-01-15 NOTE — ED Provider Notes (Signed)
[facility] Emergency Department Provider Note  ____________________________________________  Time seen: Approximately 8:09 AM  I have reviewed the triage vital signs and the nursing notes.   HISTORY  Chief Complaint Generalized Body Aches and Influenza   HPI Marcus Hill is a 25 y.o. male is here with complaint of vomiting and diarrhea since yesterday. Patient states that he is been around people have been sick and work. He does not have any fever at home or in triage. Patient has continued to drink fluids and has been eating sparingly. He denies any abdominal pain. Currently he rates his discomfort as a 6/10.   Past Medical History  Diagnosis Date  . MRSA infection   . GERD (gastroesophageal reflux disease)   . Cold   . Esophageal hernia     Patient Active Problem List   Diagnosis Date Noted  . Hiatal hernia 01/07/2016  . Loss of weight   . Diarrhea   . Psychogenic vomiting with nausea   . Nausea and vomiting 12/04/2015  . Left upper quadrant pain 10/31/2015  . Heartburn 10/31/2015  . Pain, dental 10/31/2015  . Chronic diarrhea 10/17/2015  . Abnormal weight loss 10/17/2015  . Tobacco abuse 10/17/2015    Past Surgical History  Procedure Laterality Date  . Colonoscopy with propofol N/A 12/09/2015    Procedure: COLONOSCOPY WITH PROPOFOL;  Surgeon: Midge Miniumarren Wohl, MD;  [facility];  Service: Endoscopy;  Laterality: N/A;  . Esophagogastroduodenoscopy (egd) with propofol N/A 12/09/2015    Procedure: ESOPHAGOGASTRODUODENOSCOPY (EGD) WITH PROPOFOL;  Surgeon: Midge Miniumarren Wohl, MD;  [facility];  Service: Endoscopy;  Laterality: N/A;    Current Outpatient Rx  Name  Route  Sig  Dispense  Refill  . Multiple Vitamins-Minerals (HM MULTIVITAMIN ADULT GUMMY PO)   Oral   Take by mouth.         Marland Kitchen. omeprazole (PRILOSEC) 40 MG capsule   Oral   Take 1 capsule (40 mg total) by mouth daily.   30 capsule   5   . ondansetron  (ZOFRAN ODT) 4 MG disintegrating tablet   Oral   Take 1 tablet (4 mg total) by mouth every 8 (eight) hours as needed for nausea or vomiting.   20 tablet   0     Allergies Review of patient's allergies indicates no known allergies.  Family History  Problem Relation Age of Onset  . Hypertension Mother   . Hyperlipidemia Mother   . Diabetes Father   . Heart disease Father   . Heart attack Father   . Diabetes Maternal Grandmother   . Diabetes Maternal Grandfather   . Diabetes Paternal Grandmother   . Diabetes Paternal Grandfather   . Cancer Neg Hx   . COPD Neg Hx   . Stroke Neg Hx     Social History Social History  Substance Use Topics  . Smoking status: Current Every Day Smoker -- 0.50 packs/day for 4 years    Types: Cigarettes  . Smokeless tobacco: Never Used  . Alcohol Use: No    Review of Systems Constitutional: No fever/chills ENT: No sore throat. Cardiovascular: Denies chest pain. Respiratory: Denies shortness of breath. Gastrointestinal: No abdominal pain.  Positive nausea, positive vomiting. Positive diarrhea.  No constipation. Genitourinary: Negative for dysuria. Musculoskeletal: Positive muscle aches. Skin: Negative for rash. Neurological: Negative for headaches, focal weakness or numbness.  10-point ROS otherwise negative.  ____________________________________________   PHYSICAL EXAM:  VITAL SIGNS: ED Triage Vitals  Enc Vitals Group  BP 01/15/16 0759 130/78 mmHg     Pulse Rate 01/15/16 0759 62     Resp 01/15/16 0759 17     Temp 01/15/16 0759 98.2 F (36.8 C)     Temp Source 01/15/16 0759 Oral     SpO2 01/15/16 0759 99 %     Weight 01/15/16 0759 176 lb (79.833 kg)     Height 01/15/16 0759  (1.753 m)     Head Cir --      Peak Flow --      Pain Score 01/15/16 0759 6     Pain Loc --      Pain Edu? --      Excl. in GC? --     Constitutional: Alert and oriented. Well appearing and in no acute distress. Eyes: Conjunctivae are normal.  PERRL. EOMI. Head: Atraumatic. Nose: No congestion/rhinnorhea. Mouth/Throat: Mucous membranes are moist.  Oropharynx non-erythematous. Neck: No stridor.   Cardiovascular: Normal rate, regular rhythm. Grossly normal heart sounds.  Good peripheral circulation. Respiratory: Normal respiratory effort.  No retractions. Lungs CTAB. Gastrointestinal: Soft and nontender. No distention. Bowel sounds are hyperactive at present. Musculoskeletal: Moves upper and lower extremity is without any difficulty and normal gait was noted. Neurologic:  Normal speech and language. No gross focal neurologic deficits are appreciated. No gait instability. Skin:  Skin is warm, dry and intact. No rash noted. Psychiatric: Mood and affect are normal. Speech and behavior are normal.  ____________________________________________   LABS (all labs ordered are listed, but only abnormal results are displayed)  Labs Reviewed - No data to display  PROCEDURES  Procedure(s) performed: None  Critical Care performed: No  ____________________________________________   INITIAL IMPRESSION / ASSESSMENT AND PLAN / ED COURSE  Pertinent labs & imaging results that were available during my care of the patient were reviewed by me and considered in my medical decision making (see chart for details).  Patient was given a prescription for Zofran and told to remain on clear liquids for the next 24 hours. He is to follow-up with his family doctor or [facility] if any continued problems. ____________________________________________   FINAL CLINICAL IMPRESSION(S) / ED DIAGNOSES  Final diagnoses:  Viral gastroenteritis      Tommi Rumps, PA-C 01/15/16 1424  Phineas Semen, MD 01/15/16 [phone]

## 2016-01-15 NOTE — ED Notes (Signed)
Pt reports flu like sx's that began yesterday morning.

## 2016-02-10 ENCOUNTER — Emergency Department
Admission: EM | Admit: 2016-02-10 | Discharge: 2016-02-10 | Disposition: A | Discharge status: Home / Self Care | Payer: 59 | Attending: Emergency Medicine | Admitting: Emergency Medicine

## 2016-02-10 ENCOUNTER — Telehealth: Payer: Self-pay | Admitting: Family Medicine

## 2016-02-10 ENCOUNTER — Encounter: Payer: Self-pay | Admitting: Emergency Medicine

## 2016-02-10 DIAGNOSIS — K219 Gastro-esophageal reflux disease without esophagitis: Secondary | ICD-10-CM | POA: Diagnosis not present

## 2016-02-10 DIAGNOSIS — F17210 Nicotine dependence, cigarettes, uncomplicated: Secondary | ICD-10-CM | POA: Insufficient documentation

## 2016-02-10 DIAGNOSIS — F1210 Cannabis abuse, uncomplicated: Secondary | ICD-10-CM | POA: Diagnosis not present

## 2016-02-10 DIAGNOSIS — K920 Hematemesis: Secondary | ICD-10-CM | POA: Diagnosis present

## 2016-02-10 LAB — COMPREHENSIVE METABOLIC PANEL
ALK PHOS: 56 U/L (ref 38–126)
ALT: 17 U/L (ref 17–63)
ANION GAP: 5 (ref 5–15)
AST: 16 U/L (ref 15–41)
Albumin: 4.8 g/dL (ref 3.5–5.0)
BILIRUBIN TOTAL: 0.4 mg/dL (ref 0.3–1.2)
BUN: 11 mg/dL (ref 6–20)
CALCIUM: 9.3 mg/dL (ref 8.9–10.3)
CO2: 27 mmol/L (ref 22–32)
Chloride: 107 mmol/L (ref 101–111)
Creatinine, Ser: 0.84 mg/dL (ref 0.61–1.24)
GFR calc Af Amer: >60 mL/min (ref >60)
Glucose, Bld: 102 mg/dL — ABNORMAL HIGH (ref 65–99)
POTASSIUM: 3.9 mmol/L (ref 3.5–5.1)
SODIUM: 139 mmol/L (ref 135–145)
TOTAL PROTEIN: 7.5 g/dL (ref 6.5–8.1)

## 2016-02-10 LAB — CBC WITH DIFFERENTIAL/PLATELET
BASOS ABS: 0.1 10*3/uL (ref 0–0.1)
Basophils Relative: 1 %
EOS ABS: 0.3 10*3/uL (ref 0–0.7)
EOS PCT: 4 %
HCT: 43.7 % (ref 40.0–52.0)
Hemoglobin: 14.8 g/dL (ref 13.0–18.0)
LYMPHS PCT: 28 %
Lymphs Abs: 2.2 10*3/uL (ref 1.0–3.6)
MCH: 30.5 pg (ref 26.0–34.0)
MCHC: 33.8 g/dL (ref 32.0–36.0)
MCV: 90 fL (ref 80.0–100.0)
Monocytes Absolute: 0.5 10*3/uL (ref 0.2–1.0)
Monocytes Relative: 6 %
NEUTROS PCT: 61 %
Neutro Abs: 4.7 10*3/uL (ref 1.4–6.5)
PLATELETS: 160 10*3/uL (ref 150–440)
RBC: 4.85 MIL/uL (ref 4.40–5.90)
RDW: 13.5 % (ref 11.5–14.5)
WBC: 7.7 10*3/uL (ref 3.8–10.6)

## 2016-02-10 MED ORDER — SUCRALFATE 1 G PO TABS: 1.0000 g | ORAL_TABLET | Freq: Four times a day (QID) | ORAL | Status: DC

## 2016-02-10 NOTE — ED Provider Notes (Addendum)
[facility] Emergency Department Provider Note     Time seen: ----------------------------------------- 4:09 PM on 02/10/2016 -----------------------------------------    I have reviewed the triage vital signs and the nursing notes.   HISTORY  Chief Complaint Hematemesis    HPI Marcus Hill is a 25 y.o. male ho presents ER for vomiting.patient states he has a hiatal hernia and this causes some vomiting in the morning. Occasionally he spit up some blood, he describes this as a small amount currently he is taking aspirin reflux medications and he feels like he is taking it too late night. Patient denies any complaints currently.   Past Medical History  Diagnosis Date  . MRSA infection   . GERD (gastroesophageal reflux disease)   . Cold   . Esophageal hernia     Patient Active Problem List   Diagnosis Date Noted  . Hiatal hernia 01/07/2016  . Loss of weight   . Diarrhea   . Psychogenic vomiting with nausea   . Nausea and vomiting 12/04/2015  . Left upper quadrant pain 10/31/2015  . Heartburn 10/31/2015  . Pain, dental 10/31/2015  . Chronic diarrhea 10/17/2015  . Abnormal weight loss 10/17/2015  . Tobacco abuse 10/17/2015    Past Surgical History  Procedure Laterality Date  . Colonoscopy with propofol N/A 12/09/2015    Procedure: COLONOSCOPY WITH PROPOFOL;  Surgeon: Midge Minium, MD;  Location: [facility];  Service: Endoscopy;  Laterality: N/A;  . Esophagogastroduodenoscopy (egd) with propofol N/A 12/09/2015    Procedure: ESOPHAGOGASTRODUODENOSCOPY (EGD) WITH PROPOFOL;  Surgeon: Midge Minium, MD;  Location: [facility];  Service: Endoscopy;  Laterality: N/A;    Allergies Review of patient's allergies indicates no known allergies.  Social History Social History  Substance Use Topics  . Smoking status: Current Every Day Smoker -- 0.50 packs/day for 4 years    Types: Cigarettes  . Smokeless tobacco: Never Used  . Alcohol  Use: No    Review of Systems Constitutional: Negative for fever. Eyes: Negative for visual changes. ENT: Negative for sore throat. Cardiovascular: Negative for chest pain. Respiratory: Negative for shortness of breath. Gastrointestinal: Negative for abdominal pain, positive for occasional vomiting Genitourinary: Negative for dysuria. Musculoskeletal: Negative for back pain. Skin: Negative for rash. Neurological: Negative for headaches, focal weakness or numbness.  10-point ROS otherwise negative.  ____________________________________________   PHYSICAL EXAM:  VITAL SIGNS: ED Triage Vitals  Enc Vitals Group     BP 02/10/16 1543 141/67 mmHg     Pulse Rate 02/10/16 1543 57     Resp 02/10/16 1543 16     Temp 02/10/16 1543 98.3 F (36.8 C)     Temp Source 02/10/16 1543 Oral     SpO2 02/10/16 1543 97 %     Weight 02/10/16 1543 173 lb (78.472 kg)     Height 02/10/16 1543  (1.727 m)     Head Cir --      Peak Flow --      Pain Score 02/10/16 1544 0     Pain Loc --      Pain Edu? --      Excl. in GC? --     Constitutional: Alert and oriented. Well appearing and in no distress. Eyes: Conjunctivae are normal. PERRL. Normal extraocular movements. ENT   Head: Normocephalic and atraumatic.   Nose: No congestion/rhinnorhea.   Mouth/Throat: Mucous membranes are moist.   Neck: No stridor. Cardiovascular: Normal rate, regular rhythm. No murmurs, rubs, or gallops. Respiratory: Normal respiratory  effort without tachypnea nor retractions. Breath sounds are clear and equal bilaterally. No wheezes/rales/rhonchi. Gastrointestinal: Soft and nontender. Normal bowel sounds Musculoskeletal: Nontender with normal range of motion in all extremities. No lower extremity tenderness nor edema. Neurologic:  Normal speech and language. No gross focal neurologic deficits are appreciated.  Skin:  Skin is warm, dry and intact. No rash noted. Psychiatric: Mood and affect are normal.  Speech and behavior are normal.  ____________________________________________  ED COURSE:  Pertinent labs & imaging results that were available during my care of the patient were reviewed by me and considered in my medical decision making (see chart for details). Patient is no acute distress, will check basic labs and reevaluate. ____________________________________________    LABS (pertinent positives/negatives)  Labs Reviewed  COMPREHENSIVE METABOLIC PANEL - Abnormal; Notable for the following:    Glucose, Bld 102 (*)    All other components within normal limits  CBC WITH DIFFERENTIAL/PLATELET   ____________________________________________  FINAL ASSESSMENT AND PLAN  GERD  Plan: Patient with labs and imaging as dictated above. I will advise him to take the reflux medicine earlier in the evening. I will also prescribe Carafate for him to take as directed.   Emily FilbertWilliams, Marty Sadlowski E, MD   Emily FilbertJonathan E Shinichi Anguiano, MD 02/10/16 1621  Emily FilbertJonathan E Tekelia Kareem, MD 02/10/16 [id number]

## 2016-02-10 NOTE — Telephone Encounter (Signed)
ERRENOUS °

## 2016-02-10 NOTE — Discharge Instructions (Signed)

## 2016-02-10 NOTE — ED Notes (Signed)
Patient states he has been spitting up blood.  Onset of symptoms this morning.  Describes small amount of blood.  Currently taking acid reflux medicine for GERD.

## 2016-02-10 NOTE — ED Notes (Signed)
Patient states he has a hiatal hernia, which causes some vomiting in the morning.

## 2016-02-21 ENCOUNTER — Telehealth: Payer: Self-pay | Admitting: Family Medicine

## 2016-02-21 NOTE — Telephone Encounter (Signed)
Called pt back sister states he is having some family issues and is having thoughts of suicide and wanted advice.  I told her to immediately take him to ER

## 2016-02-21 NOTE — Telephone Encounter (Signed)
Pt would like a call back. States its an emergency and wants a call from Dr Sherie DonLada

## 2016-03-09 ENCOUNTER — Encounter: Payer: Self-pay | Admitting: Emergency Medicine

## 2016-03-09 ENCOUNTER — Emergency Department
Admission: EM | Admit: 2016-03-09 | Discharge: 2016-03-09 | Disposition: A | Discharge status: Home / Self Care | Payer: 59 | Attending: Emergency Medicine | Admitting: Emergency Medicine

## 2016-03-09 DIAGNOSIS — F1290 Cannabis use, unspecified, uncomplicated: Secondary | ICD-10-CM | POA: Insufficient documentation

## 2016-03-09 DIAGNOSIS — F17210 Nicotine dependence, cigarettes, uncomplicated: Secondary | ICD-10-CM | POA: Insufficient documentation

## 2016-03-09 DIAGNOSIS — K5900 Constipation, unspecified: Secondary | ICD-10-CM | POA: Diagnosis present

## 2016-03-09 MED ORDER — POLYETHYLENE GLYCOL 3350 17 GM/SCOOP PO POWD: 17.0000 g | Freq: Every day | ORAL | Status: DC

## 2016-03-09 NOTE — ED Provider Notes (Signed)
[facility] Emergency Department Provider Note  Time seen: 4:58 PM  I have reviewed the triage vital signs and the nursing notes.   HISTORY  Chief Complaint Constipation    HPI Marcus Hill is a 25 y.o. male with a past medical history of gastric reflux presents the emergency departmen with constipation. According to the patient for the past 2 weeks he has had very small bowel movements but feels like he needs to have a bigger bowel movement. The patient has not taken any over-the-counter medications. Denies abdominal pain, fever, nausea, vomiting.     Past Medical History  Diagnosis Date  . MRSA infection   . GERD (gastroesophageal reflux disease)   . Cold   . Esophageal hernia     Patient Active Problem List   Diagnosis Date Noted  . Hiatal hernia 01/07/2016  . Loss of weight   . Diarrhea   . Psychogenic vomiting with nausea   . Nausea and vomiting 12/04/2015  . Left upper quadrant pain 10/31/2015  . Heartburn 10/31/2015  . Pain, dental 10/31/2015  . Chronic diarrhea 10/17/2015  . Abnormal weight loss 10/17/2015  . Tobacco abuse 10/17/2015    Past Surgical History  Procedure Laterality Date  . Colonoscopy with propofol N/A 12/09/2015    Procedure: COLONOSCOPY WITH PROPOFOL;  Surgeon: Midge Miniumarren Wohl, MD;  [facility];  Service: Endoscopy;  Laterality: N/A;  . Esophagogastroduodenoscopy (egd) with propofol N/A 12/09/2015    Procedure: ESOPHAGOGASTRODUODENOSCOPY (EGD) WITH PROPOFOL;  Surgeon: Midge Miniumarren Wohl, MD;  [facility];  Service: Endoscopy;  Laterality: N/A;    Current Outpatient Rx  Name  Route  Sig  Dispense  Refill  . Multiple Vitamins-Minerals (HM MULTIVITAMIN ADULT GUMMY PO)   Oral   Take by mouth.         Marland Kitchen. omeprazole (PRILOSEC) 40 MG capsule   Oral   Take 1 capsule (40 mg total) by mouth daily.   30 capsule   5   . ondansetron (ZOFRAN ODT) 4 MG disintegrating tablet   Oral   Take 1  tablet (4 mg total) by mouth every 8 (eight) hours as needed for nausea or vomiting.   20 tablet   0   . sucralfate (CARAFATE) 1 g tablet   Oral   Take 1 tablet (1 g total) by mouth 4 (four) times daily.   120 tablet   1     Allergies Review of patient's allergies indicates no known allergies.  Family History  Problem Relation Age of Onset  . Hypertension Mother   . Hyperlipidemia Mother   . Diabetes Father   . Heart disease Father   . Heart attack Father   . Diabetes Maternal Grandmother   . Diabetes Maternal Grandfather   . Diabetes Paternal Grandmother   . Diabetes Paternal Grandfather   . Cancer Neg Hx   . COPD Neg Hx   . Stroke Neg Hx     Social History Social History  Substance Use Topics  . Smoking status: Current Every Day Smoker -- 0.50 packs/day for 4 years    Types: Cigarettes  . Smokeless tobacco: Never Used  . Alcohol Use: No    Review of Systems Constitutional: Negative for fever. Cardiovascular: Negative for chest pain. Respiratory: Negative for shortness of breath. Gastrointestinal: Negative for abdominal pain. Positive for constipation. Musculoskeletal: Negative for back pain. Neurological: Negative for headache 10-point ROS otherwise negative.  ____________________________________________   PHYSICAL EXAM:  VITAL SIGNS: ED Triage  Vitals  Enc Vitals Group     BP 03/09/16 1546 128/86 mmHg     Pulse Rate 03/09/16 1546 68     Resp 03/09/16 1546 16     Temp 03/09/16 1546 98.2 F (36.8 C)     Temp Source 03/09/16 1546 Oral     SpO2 03/09/16 1546 100 %     Weight 03/09/16 1546 168 lb (76.204 kg)     Height 03/09/16 1546  (1.727 m)     Head Cir --      Peak Flow --      Pain Score 03/09/16 1546 3     Pain Loc --      Pain Edu? --      Excl. in GC? --     Constitutional: Alert and oriented. Well appearing and in no distress. Eyes: Normal exam ENT   Head: Normocephalic and atraumatic   Mouth/Throat: Mucous membranes are  moist. Cardiovascular: Normal rate, regular rhythm. No murmur Respiratory: Normal respiratory effort without tachypnea nor retractions. Breath sounds are clear  Gastrointestinal: Soft and nontender. No distention. Musculoskeletal: Nontender with normal range of motion in all extremities. Neurologic:  Normal speech and language. No gross focal neurologic deficits Skin:  Skin is warm, dry and intact.  Psychiatric: Mood and affect are normal.   ____________________________________________    INITIAL IMPRESSION / ASSESSMENT AND PLAN / ED COURSE  Pertinent labs & imaging results that were available during my care of the patient were reviewed by me and considered in my medical decision making (see chart for details).  Patient presents the emergency department with constipation for the past 2 weeks. Patient states over the past 2 weeks he has been able to have bowel movements but they've been small. He has not taken any stool softeners or laxatives. I discussed obtaining x-ray images, the patient states he rather not, he just came for advice on what to take to relieve his constipation. Patient has a nontender abdominal exam, appears very well with reassuring vitals. I discussed the use of MiraLAX to the patient, he states he will start taking it today. We'll discharge the patient home. I discussed with the patient if he has not had a bowel movement in 2-3 days on MiraLAX to return to the emergency department for x-rays.  ____________________________________________   FINAL CLINICAL IMPRESSION(S) / ED DIAGNOSES  constipation   Minna Antis, MD 03/09/16 1701

## 2016-03-09 NOTE — ED Notes (Signed)
Reports unable to have BM x 2 wks.  States he tried a stool softener from [employer] without relief.

## 2016-03-09 NOTE — Discharge Instructions (Signed)

## 2016-03-11 ENCOUNTER — Encounter: Payer: Self-pay | Admitting: Emergency Medicine

## 2016-03-11 ENCOUNTER — Emergency Department
Admission: EM | Admit: 2016-03-11 | Discharge: 2016-03-11 | Disposition: A | Discharge status: Home / Self Care | Payer: 59 | Attending: Emergency Medicine | Admitting: Emergency Medicine

## 2016-03-11 DIAGNOSIS — S46912A Strain of unspecified muscle, fascia and tendon at shoulder and upper arm level, left arm, initial encounter: Secondary | ICD-10-CM | POA: Insufficient documentation

## 2016-03-11 DIAGNOSIS — Y939 Activity, unspecified: Secondary | ICD-10-CM | POA: Insufficient documentation

## 2016-03-11 DIAGNOSIS — Y999 Unspecified external cause status: Secondary | ICD-10-CM | POA: Diagnosis not present

## 2016-03-11 DIAGNOSIS — K219 Gastro-esophageal reflux disease without esophagitis: Secondary | ICD-10-CM | POA: Insufficient documentation

## 2016-03-11 DIAGNOSIS — F1290 Cannabis use, unspecified, uncomplicated: Secondary | ICD-10-CM | POA: Insufficient documentation

## 2016-03-11 DIAGNOSIS — M25512 Pain in left shoulder: Secondary | ICD-10-CM | POA: Diagnosis present

## 2016-03-11 DIAGNOSIS — X58XXXA Exposure to other specified factors, initial encounter: Secondary | ICD-10-CM | POA: Insufficient documentation

## 2016-03-11 DIAGNOSIS — Z79899 Other long term (current) drug therapy: Secondary | ICD-10-CM | POA: Diagnosis not present

## 2016-03-11 DIAGNOSIS — F17210 Nicotine dependence, cigarettes, uncomplicated: Secondary | ICD-10-CM | POA: Diagnosis not present

## 2016-03-11 DIAGNOSIS — Y929 Unspecified place or not applicable: Secondary | ICD-10-CM | POA: Insufficient documentation

## 2016-03-11 MED ORDER — IBUPROFEN 800 MG PO TABS
800.0000 mg | ORAL_TABLET | Freq: Once | ORAL | Status: AC
  Administered 2016-03-11: 800 mg via ORAL

## 2016-03-11 MED ORDER — TRAMADOL HCL 50 MG PO TABS
ORAL_TABLET | ORAL | Status: AC
  Administered 2016-03-11: 50 mg via ORAL
  Filled 2016-03-11: qty 1

## 2016-03-11 MED ORDER — IBUPROFEN 800 MG PO TABS
ORAL_TABLET | ORAL | Status: AC
  Administered 2016-03-11: 800 mg via ORAL
  Filled 2016-03-11: qty 1

## 2016-03-11 MED ORDER — METHOCARBAMOL 500 MG PO TABS
ORAL_TABLET | ORAL | Status: AC
  Administered 2016-03-11: 1000 mg via ORAL
  Filled 2016-03-11: qty 2

## 2016-03-11 MED ORDER — IBUPROFEN 800 MG PO TABS: 800.0000 mg | ORAL_TABLET | Freq: Three times a day (TID) | ORAL | Status: DC | PRN

## 2016-03-11 MED ORDER — TRAMADOL HCL 50 MG PO TABS: 50.0000 mg | ORAL_TABLET | Freq: Four times a day (QID) | ORAL | Status: DC | PRN

## 2016-03-11 MED ORDER — METHOCARBAMOL 750 MG PO TABS: 750.0000 mg | ORAL_TABLET | Freq: Four times a day (QID) | ORAL | Status: DC

## 2016-03-11 MED ORDER — METHOCARBAMOL 500 MG PO TABS
1000.0000 mg | ORAL_TABLET | Freq: Once | ORAL | Status: AC
  Administered 2016-03-11: 1000 mg via ORAL

## 2016-03-11 MED ORDER — TRAMADOL HCL 50 MG PO TABS
50.0000 mg | ORAL_TABLET | Freq: Once | ORAL | Status: AC
  Administered 2016-03-11: 50 mg via ORAL

## 2016-03-11 NOTE — ED Provider Notes (Signed)
[facility] Emergency Department Provider Note   ____________________________________________  Time seen: Approximately 8:51 AM  I have reviewed the triage vital signs and the nursing notes.   HISTORY  Chief Complaint Dental Pain    HPI Marcus Hill is a 25 y.o. male patient complain of left shoulder pain that radiated chest to the left side of his neck for 2 days. Patient state he had dental pain a month ago requiring extensive cleaning and was told he needed a root canal of his teeth. Patient was concerned it might be pain radiating from his teeth. Patient state pain increase with movement of the left shoulder and overhead reaching. Denies any loss of sensation. No palliative measures taken for this complaint. Patient rated his pain as a 10 over 10. Patient describes pain as sharp and spasmodic.   Past Medical History  Diagnosis Date  . MRSA infection   . GERD (gastroesophageal reflux disease)   . Cold   . Esophageal hernia     Patient Active Problem List   Diagnosis Date Noted  . Hiatal hernia 01/07/2016  . Loss of weight   . Diarrhea   . Psychogenic vomiting with nausea   . Nausea and vomiting 12/04/2015  . Left upper quadrant pain 10/31/2015  . Heartburn 10/31/2015  . Pain, dental 10/31/2015  . Chronic diarrhea 10/17/2015  . Abnormal weight loss 10/17/2015  . Tobacco abuse 10/17/2015    Past Surgical History  Procedure Laterality Date  . Colonoscopy with propofol N/A 12/09/2015    Procedure: COLONOSCOPY WITH PROPOFOL;  Surgeon: Midge Minium, MD;  Location: [facility];  Service: Endoscopy;  Laterality: N/A;  . Esophagogastroduodenoscopy (egd) with propofol N/A 12/09/2015    Procedure: ESOPHAGOGASTRODUODENOSCOPY (EGD) WITH PROPOFOL;  Surgeon: Midge Minium, MD;  Location: [facility];  Service: Endoscopy;  Laterality: N/A;    Current Outpatient Rx  Name  Route  Sig  Dispense  Refill  . ibuprofen (ADVIL,MOTRIN) 800 MG  tablet   Oral   Take 1 tablet (800 mg total) by mouth every 8 (eight) hours as needed.   30 tablet   0   . methocarbamol (ROBAXIN-750) 750 MG tablet   Oral   Take 1 tablet (750 mg total) by mouth 4 (four) times daily.   20 tablet   0   . Multiple Vitamins-Minerals (HM MULTIVITAMIN ADULT GUMMY PO)   Oral   Take by mouth.         Marland Kitchen omeprazole (PRILOSEC) 40 MG capsule   Oral   Take 1 capsule (40 mg total) by mouth daily.   30 capsule   5   . ondansetron (ZOFRAN ODT) 4 MG disintegrating tablet   Oral   Take 1 tablet (4 mg total) by mouth every 8 (eight) hours as needed for nausea or vomiting.   20 tablet   0   . polyethylene glycol powder (GLYCOLAX/MIRALAX) powder   Oral   Take 17 g by mouth daily.   255 g   0   . sucralfate (CARAFATE) 1 g tablet   Oral   Take 1 tablet (1 g total) by mouth 4 (four) times daily.   120 tablet   1   . traMADol (ULTRAM) 50 MG tablet   Oral   Take 1 tablet (50 mg total) by mouth every 6 (six) hours as needed for moderate pain.   12 tablet   0     Allergies Review of patient's allergies indicates no known allergies.  Family History  Problem Relation Age of Onset  . Hypertension Mother   . Hyperlipidemia Mother   . Diabetes Father   . Heart disease Father   . Heart attack Father   . Diabetes Maternal Grandmother   . Diabetes Maternal Grandfather   . Diabetes Paternal Grandmother   . Diabetes Paternal Grandfather   . Cancer Neg Hx   . COPD Neg Hx   . Stroke Neg Hx     Social History Social History  Substance Use Topics  . Smoking status: Current Every Day Smoker -- 0.50 packs/day for 4 years    Types: Cigarettes  . Smokeless tobacco: Never Used  . Alcohol Use: No    Review of Systems Constitutional: No fever/chills Eyes: No visual changes. ENT: No sore throat. Cardiovascular: Denies chest pain. Respiratory: Denies shortness of breath. Gastrointestinal: No abdominal pain.  No nausea, no vomiting.  No diarrhea.   No constipation. Genitourinary: Negative for dysuria. Musculoskeletal: Neck and shoulder pain  Skin: Negative for rash. Neurological: Negative for headaches, focal weakness or numbness.   ____________________________________________   PHYSICAL EXAM:  VITAL SIGNS: ED Triage Vitals  Enc Vitals Group     BP 03/11/16 0843 142/90 mmHg     Pulse Rate 03/11/16 0843 83     Resp 03/11/16 0843 16     Temp 03/11/16 0843 97.5 F (36.4 C)     Temp Source 03/11/16 0843 Oral     SpO2 03/11/16 0843 98 %     Weight 03/11/16 0843 168 lb (76.204 kg)     Height 03/11/16 0843 5\' 8"  (1.727 m)     Head Cir --      Peak Flow --      Pain Score 03/11/16 0843 10     Pain Loc --      Pain Edu? --      Excl. in GC? --     Constitutional: Alert and oriented. Well appearing and in no acute distress. Eyes: Conjunctivae are normal. PERRL. EOMI. Head: Atraumatic. Nose: No congestion/rhinnorhea. Mouth/Throat: Mucous membranes are moist.  Oropharynx non-erythematous. Neck: No stridor. No cervical spine tenderness to palpation. Hematological/Lymphatic/Immunilogical: No cervical lymphadenopathy. Cardiovascular: Normal rate, regular rhythm. Grossly normal heart sounds.  Good peripheral circulation. Respiratory: Normal respiratory effort.  No retractions. Lungs CTAB. Gastrointestinal: Soft and nontender. No distention. No abdominal bruits. No CVA tenderness. Musculoskeletal: No obvious deformity of the left shoulder. Patient has some moderate guarding palpation mid scapular muscle. Patient has full nuchal range of motion and mild muscle spasms the mid scapular muscle group. Neurologic:  Normal speech and language. No gross focal neurologic deficits are appreciated. No gait instability. Skin:  Skin is warm, dry and intact. No rash noted. Psychiatric: Mood and affect are normal. Speech and behavior are normal.  ____________________________________________   LABS (all labs ordered are listed, but only  abnormal results are displayed)  Labs Reviewed - No data to display ____________________________________________  EKG   ____________________________________________  RADIOLOGY   ____________________________________________   PROCEDURES  Procedure(s) performed: None  Critical Care performed: No  ____________________________________________   INITIAL IMPRESSION / ASSESSMENT AND PLAN / ED COURSE  Pertinent labs & imaging results that were available during my care of the patient were reviewed by me and considered in my medical decision making (see chart for details).  Left scapular muscle strain. Patient given discharge care instructions sheet given a prescription for Robaxin, ibuprofen, tramadol. Patient given a work note for one day. Patient advised follow-up family doctor this  condition persists. ____________________________________________   FINAL CLINICAL IMPRESSION(S) / ED DIAGNOSES  Final diagnoses:  Muscle strain of scapular region, left, initial encounter      NEW MEDICATIONS STARTED DURING THIS VISIT:  New Prescriptions   IBUPROFEN (ADVIL,MOTRIN) 800 MG TABLET    Take 1 tablet (800 mg total) by mouth every 8 (eight) hours as needed.   METHOCARBAMOL (ROBAXIN-750) 750 MG TABLET    Take 1 tablet (750 mg total) by mouth 4 (four) times daily.   TRAMADOL (ULTRAM) 50 MG TABLET    Take 1 tablet (50 mg total) by mouth every 6 (six) hours as needed for moderate pain.     Note:  This document was prepared using Dragon voice recognition software and may include unintentional dictation errors.    Joni Reining, PA-C 03/11/16 8295  Minna Antis, MD 03/11/16 [phone]

## 2016-03-11 NOTE — ED Notes (Addendum)
Pt here with left sided neck pain that radiates into left side of head. Pt reports needs a root canal on the upper right side of mouth. Pt alert and oriented upon arrival, ambulatory to room.

## 2016-03-11 NOTE — Discharge Instructions (Signed)
Muscle Strain °A muscle strain (pulled muscle) happens when a muscle is stretched beyond normal length. It happens when a sudden, violent force stretches your muscle too far. Usually, a few of the fibers in your muscle are torn. Muscle strain is common in athletes. Recovery usually takes 1-2 weeks. Complete healing takes 5-6 weeks.  °HOME CARE  °· Follow the PRICE method of treatment to help your injury get better. Do this the first 2-3 days after the injury: °¨ Protect. Protect the muscle to keep it from getting injured again. °¨ Rest. Limit your activity and rest the injured body part. °¨ Ice. Put ice in a plastic bag. Place a towel between your skin and the bag. Then, apply the ice and leave it on from 15-20 minutes each hour. After the third day, switch to moist heat packs. °¨ Compression. Use a splint or elastic bandage on the injured area for comfort. Do not put it on too tightly. °¨ Elevate. Keep the injured body part above the level of your heart. °· Only take medicine as told by your doctor. °· Warm up before doing exercise to prevent future muscle strains. °GET HELP IF:  °· You have more pain or puffiness (swelling) in the injured area. °· You feel numbness, tingling, or notice a loss of strength in the injured area. °MAKE SURE YOU:  °· Understand these instructions. °· Will watch your condition. °· Will get help right away if you are not doing well or get worse. °  °This information is not intended to replace advice given to you by your health care provider. Make sure you discuss any questions you have with your health care provider. °  °Document Released: 07/28/2008 Document Revised: 08/09/2013 Document Reviewed: 05/18/2013 °Elsevier Interactive Patient Education ©2016 [employer] Inc[employer] ° °

## 2016-08-31 ENCOUNTER — Emergency Department
Admission: EM | Admit: 2016-08-31 | Discharge: 2016-08-31 | Disposition: A | Discharge status: Home / Self Care | Payer: 59 | Attending: Emergency Medicine | Admitting: Emergency Medicine

## 2016-08-31 ENCOUNTER — Encounter: Payer: Self-pay | Admitting: Emergency Medicine

## 2016-08-31 DIAGNOSIS — R001 Bradycardia, unspecified: Secondary | ICD-10-CM | POA: Insufficient documentation

## 2016-08-31 DIAGNOSIS — F17210 Nicotine dependence, cigarettes, uncomplicated: Secondary | ICD-10-CM | POA: Insufficient documentation

## 2016-08-31 DIAGNOSIS — R55 Syncope and collapse: Secondary | ICD-10-CM | POA: Insufficient documentation

## 2016-08-31 LAB — CBC
HCT: 42.8 % (ref 40.0–52.0)
Hemoglobin: 14.7 g/dL (ref 13.0–18.0)
MCH: 31.4 pg (ref 26.0–34.0)
MCHC: 34.3 g/dL (ref 32.0–36.0)
MCV: 91.4 fL (ref 80.0–100.0)
PLATELETS: 168 10*3/uL (ref 150–440)
RBC: 4.68 MIL/uL (ref 4.40–5.90)
RDW: 13.9 % (ref 11.5–14.5)
WBC: 8.6 10*3/uL (ref 3.8–10.6)

## 2016-08-31 LAB — BASIC METABOLIC PANEL
Anion gap: 4 — ABNORMAL LOW (ref 5–15)
BUN: 10 mg/dL (ref 6–20)
CHLORIDE: 110 mmol/L (ref 101–111)
CO2: 26 mmol/L (ref 22–32)
CREATININE: 0.79 mg/dL (ref 0.61–1.24)
Calcium: 8.9 mg/dL (ref 8.9–10.3)
GFR calc Af Amer: >60 mL/min (ref >60)
GFR calc non Af Amer: >60 mL/min (ref >60)
GLUCOSE: 105 mg/dL — AB (ref 65–99)
Potassium: 4.1 mmol/L (ref 3.5–5.1)
SODIUM: 140 mmol/L (ref 135–145)

## 2016-08-31 LAB — TROPONIN I: Troponin I: <0.03 ng/mL (ref <0.03)

## 2016-08-31 MED ORDER — SODIUM CHLORIDE 0.9 % IV SOLN
Freq: Once | INTRAVENOUS | Status: AC
  Administered 2016-08-31: 10:00:00 via INTRAVENOUS

## 2016-08-31 NOTE — ED Provider Notes (Signed)
[facility] Emergency Department Provider Note        Time seen: ----------------------------------------- 9:44 AM on 08/31/2016 -----------------------------------------    I have reviewed the triage vital signs and the nursing notes.   HISTORY  Chief Complaint Near Syncope    HPI Marcus Hill is a 25 y.o. male who presents to the ER for near syncopal episode this morning. Patient fell this morning at 7:30 in the bathroom, follows unwitnessed and patient was unresponsive. Girlfriend found him trying to wake him up unsuccessfully. Patient did feel dizzy before the fall. Patient states he works [employer], possibly could've got dehydrated. He denies any new medicines or recent illness. He denies history of syncope.   Past Medical History:  Diagnosis Date  . Cold   . Esophageal hernia   . GERD (gastroesophageal reflux disease)   . MRSA infection     Patient Active Problem List   Diagnosis Date Noted  . Hiatal hernia 01/07/2016  . Loss of weight   . Diarrhea   . Psychogenic vomiting with nausea   . Nausea and vomiting 12/04/2015  . Left upper quadrant pain 10/31/2015  . Heartburn 10/31/2015  . Pain, dental 10/31/2015  . Chronic diarrhea 10/17/2015  . Abnormal weight loss 10/17/2015  . Tobacco abuse 10/17/2015    Past Surgical History:  Procedure Laterality Date  . COLONOSCOPY WITH PROPOFOL N/A 12/09/2015   Procedure: COLONOSCOPY WITH PROPOFOL;  Surgeon: Midge Miniumarren Wohl, MD;  [facility];  Service: Endoscopy;  Laterality: N/A;  . ESOPHAGOGASTRODUODENOSCOPY (EGD) WITH PROPOFOL N/A 12/09/2015   Procedure: ESOPHAGOGASTRODUODENOSCOPY (EGD) WITH PROPOFOL;  Surgeon: Midge Miniumarren Wohl, MD;  [facility];  Service: Endoscopy;  Laterality: N/A;    Allergies Review of patient's allergies indicates no known allergies.  Social History Social History  Substance Use Topics  . Smoking status: Current Every Day Smoker   Packs/day: 0.50    Years: 4.00    Types: Cigarettes  . Smokeless tobacco: Never Used  . Alcohol use No    Review of Systems Constitutional: Negative for fever. Cardiovascular: Negative for chest pain. Respiratory: Negative for shortness of breath. Gastrointestinal: Negative for abdominal pain, vomiting and diarrhea. Genitourinary: Negative for dysuria. Musculoskeletal: Negative for back pain. Skin: Negative for rash. Neurological: Negative for headaches, focal weakness or numbness.  10-point ROS otherwise negative.  ____________________________________________   PHYSICAL EXAM:  VITAL SIGNS: ED Triage Vitals  Enc Vitals Group     BP 08/31/16 0904 (!) 120/55     Pulse Rate 08/31/16 0904 (!) 52     Resp 08/31/16 0904 16     Temp 08/31/16 0904 97.6 F (36.4 C)     Temp Source 08/31/16 0904 Oral     SpO2 08/31/16 0904 97 %     Weight 08/31/16 0859 168 lb (76.2 kg)     Height 08/31/16 0905 5\' 9"  (1.753 m)     Head Circumference --      Peak Flow --      Pain Score --      Pain Loc --      Pain Edu? --      Excl. in GC? --     Constitutional: Alert and oriented. Well appearing and in no distress. Eyes: Conjunctivae are normal. PERRL. Normal extraocular movements. ENT   Head: Normocephalic and atraumatic.   Nose: No congestion/rhinnorhea.   Mouth/Throat: Mucous membranes are moist.   Neck: No stridor. Cardiovascular: Normal rate, regular rhythm. No murmurs, rubs, or gallops. Respiratory:  Normal respiratory effort without tachypnea nor retractions. Breath sounds are clear and equal bilaterally. No wheezes/rales/rhonchi. Gastrointestinal: Soft and nontender. Normal bowel sounds Musculoskeletal: Nontender with normal range of motion in all extremities. No lower extremity tenderness nor edema. Neurologic:  Normal speech and language. No gross focal neurologic deficits are appreciated.  Skin:  Skin is warm, dry and intact. No rash noted. Psychiatric: Mood and  affect are normal. Speech and behavior are normal.  ____________________________________________  EKG: Interpreted by me.Sinus bradycardia with rate of 41 bpm, normal PR interval, normal QRS, normal QT, incomplete right bundle branch block.  ____________________________________________  ED COURSE:  Pertinent labs & imaging results that were available during my care of the patient were reviewed by me and considered in my medical decision making (see chart for details). Clinical Course  Patient presents with syncope or near-syncope of uncertain etiology. We will give IV fluids, check orthostatics and basic labs.  Procedures ____________________________________________   LABS (pertinent positives/negatives)  Labs Reviewed  BASIC METABOLIC PANEL - Abnormal; Notable for the following:       Result Value   Glucose, Bld 105 (*)    Anion gap 4 (*)    All other components within normal limits  CBC  TROPONIN I  ____________________________________________  FINAL ASSESSMENT AND PLAN  Syncope, bradycardia  Plan: Patient with labs and imaging as dictated above. No clear etiology for the patient's symptoms. He does have mild bradycardia. I discussed with cardiology who will follow up with the patient tomorrow for recheck. He may need Holter monitoring.   Emily FilbertWilliams, Stepan Verrette E, MD   Note: This dictation was prepared with Dragon dictation. Any transcriptional errors that result from this process are unintentional    Emily FilbertJonathan E Mithcell Schumpert, MD 08/31/16 1207

## 2016-08-31 NOTE — ED Triage Notes (Signed)
Pt states has been having near syncopal episodes.

## 2016-08-31 NOTE — ED Notes (Signed)
Pt fell this morning at 0730 in the bathroom. Fall was unwitnessed and pt was unresponsive with pulse for 4 minutes (found by girlfriend). Pt did feel dizzy before the fall. Pt denies new medications any denies h/s of dizziness or syncope

## 2016-09-03 ENCOUNTER — Encounter: Payer: Self-pay | Admitting: Cardiology

## 2016-09-03 ENCOUNTER — Ambulatory Visit (INDEPENDENT_AMBULATORY_CARE_PROVIDER_SITE_OTHER): Payer: Self-pay | Admitting: Cardiology

## 2016-09-03 VITALS — BP 125/78 | HR 50 | Ht 69.0 in | Wt 170.8 lb

## 2016-09-03 DIAGNOSIS — R55 Syncope and collapse: Secondary | ICD-10-CM

## 2016-09-03 DIAGNOSIS — R001 Bradycardia, unspecified: Secondary | ICD-10-CM

## 2016-09-03 NOTE — Progress Notes (Signed)
Cardiology Office Note   Date:  09/03/2016   ID:  Marcus Sheffielderry H Varnum, DOB 1991/06/25, MRN [id number]  Referring Doctor:  Baruch GoutyMelinda Lada, MD   Cardiologist:   Almond LintAileen Damauri Minion, MD   Reason for consultation:  Chief Complaint  Patient presents with  . Other    Syncope and low HR. Meds reviewed verbally with pt.      History of Present Illness: Marcus Hill is a 25 y.o. male who presents for Episode of passing out.  Patient has been busy working in [employer] for several weeks now. She woke up one day, felt lightheaded. Went back to sleep. Woke up because she needed to go to work immediately stood up, walked to the bathroom, continued to feel lightheaded. The next thing he knew he was down on the floor. The wife heard him fall to the bathroom floor, immediately came to his side. He was out for a few minutes. There was no jerking motion, no incontinence. No recurrence in-stent  Per further inquiry patient has no chest pain, shortness of breath. 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 He has no symptoms with these. He does drink a lot of coffee maybe 2 pots in a day. He drinks [facility] all day long and can probably drink more than 2 L. He does not drink water at all other than the water that is in the pot of coffee.  ROS:  Please see the history of present illness. Aside from mentioned under HPI, all other systems are reviewed and negative.     Past Medical History:  Diagnosis Date  . Cold   . Esophageal hernia   . GERD (gastroesophageal reflux disease)   . MRSA infection     Past Surgical History:  Procedure Laterality Date  . COLONOSCOPY WITH PROPOFOL N/A 12/09/2015   Procedure: COLONOSCOPY WITH PROPOFOL;  Surgeon: Midge Miniumarren Wohl, MD;  [facility];  Service: Endoscopy;  Laterality: N/A;  . ESOPHAGOGASTRODUODENOSCOPY (EGD) WITH PROPOFOL N/A 12/09/2015   Procedure: ESOPHAGOGASTRODUODENOSCOPY (EGD) WITH  PROPOFOL;  Surgeon: Midge Miniumarren Wohl, MD;  [facility];  Service: Endoscopy;  Laterality: N/A;     reports that he has been smoking Cigarettes.  He has a 10.00 pack-year smoking history. He has never used smokeless tobacco. He reports that he uses drugs, including Marijuana. He reports that he does not drink alcohol.   family history includes Diabetes in his father, maternal grandfather, maternal grandmother, paternal grandfather, and paternal grandmother; Heart attack in his father, paternal grandfather, and paternal grandmother; Heart disease in his father; Hyperlipidemia in his mother; Hypertension in his mother.   Outpatient Medications Prior to Visit  Medication Sig Dispense Refill  . ibuprofen (ADVIL,MOTRIN) 800 MG tablet Take 1 tablet (800 mg total) by mouth every 8 (eight) hours as needed. (Patient not taking: Reported on 09/03/2016) 30 tablet 0  . methocarbamol (ROBAXIN-750) 750 MG tablet Take 1 tablet (750 mg total) by mouth 4 (four) times daily. (Patient not taking: Reported on 09/03/2016) 20 tablet 0  . Multiple Vitamins-Minerals (HM MULTIVITAMIN ADULT GUMMY PO) Take by mouth.    Marland Kitchen. omeprazole (PRILOSEC) 40 MG capsule Take 1 capsule (40 mg total) by mouth daily. (Patient not taking: Reported on 09/03/2016) 30 capsule 5  . ondansetron (ZOFRAN ODT) 4 MG disintegrating tablet Take 1 tablet (4 mg total) by mouth every 8 (eight) hours as needed for nausea or vomiting. (Patient not taking: Reported on  09/03/2016) 20 tablet 0  . polyethylene glycol powder (GLYCOLAX/MIRALAX) powder Take 17 g by mouth daily. (Patient not taking: Reported on 09/03/2016) 255 g 0  . sucralfate (CARAFATE) 1 g tablet Take 1 tablet (1 g total) by mouth 4 (four) times daily. (Patient not taking: Reported on 09/03/2016) 120 tablet 1  . traMADol (ULTRAM) 50 MG tablet Take 1 tablet (50 mg total) by mouth every 6 (six) hours as needed for moderate pain. (Patient not taking: Reported on 09/03/2016) 12 tablet 0   No  facility-administered medications prior to visit.      Allergies: Review of patient's allergies indicates no known allergies.    PHYSICAL EXAM: VS:  BP 125/78 (BP Location: Left Arm, Patient Position: Sitting, Cuff Size: Normal)   Pulse (!) 50   Ht 5\' 9"  (1.753 m)   Wt 170 lb 12 oz (77.5 kg)   BMI 25.22 kg/m  , Body mass index is 25.22 kg/m. Wt Readings from Last 3 Encounters:  09/03/16 170 lb 12 oz (77.5 kg)  08/31/16 162 lb (73.5 kg)  03/11/16 168 lb (76.2 kg)    Orthostatic VS for the past 24 hrs (Last 3 readings):  BP- Lying Pulse- Lying BP- Sitting Pulse- Sitting BP- Standing at 0 minutes Pulse- Standing at 0 minutes BP- Standing at 3 minutes Pulse- Standing at 3 minutes  09/03/16 1347 124/69 52 115/71 56 124/74 66 105/71 68    GENERAL:  well developed, well nourished, not in acute distress HEENT: normocephalic, pink conjunctivae, anicteric sclerae, no xanthelasma, normal dentition, oropharynx clear NECK:  no neck vein engorgement, JVP normal, no hepatojugular reflux, carotid upstroke brisk and symmetric, no bruit, no thyromegaly, no lymphadenopathy LUNGS:  good respiratory effort, clear to auscultation bilaterally CV:  PMI not displaced, no thrills, no lifts, S1 and S2 within normal limits, no palpable S3 or S4, no murmurs, no rubs, no gallops ABD:  Soft, nontender, nondistended, normoactive bowel sounds, no abdominal aortic bruit, no hepatomegaly, no splenomegaly MS: nontender back, no kyphosis, no scoliosis, no joint deformities EXT:  2+ DP/PT pulses, no edema, no varicosities, no cyanosis, no clubbing SKIN: warm, nondiaphoretic, normal turgor, no ulcers NEUROPSYCH: alert, oriented to person, place, and time, sensory/motor grossly intact, normal mood, appropriate affect  Recent Labs: 10/17/2015: TSH 0.535 02/10/2016: ALT 17 08/31/2016: BUN 10; Creatinine, Ser 0.79; Hemoglobin 14.7; Platelets 168; Potassium 4.1; Sodium 140   Lipid Panel No results found for: CHOL, TRIG,  HDL, CHOLHDL, VLDL, LDLCALC, LDLDIRECT   Other studies Reviewed:  EKG:  The ekg from 09/03/2016 was personally reviewed by me and it revealed sinus rhythm, bradycardia, 50 BPM. Otherwise normal EKG. Normal QT and QTC.  Additional studies/ records that were reviewed personally reviewed by me today include: None available   ASSESSMENT AND PLAN:  Loss of consciousness Per history, most likely orthostatic in nature. He does not have any symptoms of palpitations, chest pain, shortness of breath. 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 It is clear that he is likely not well hydrated with significant caffeine intake from coffee and [facility]. He has baseline sinus bradycardia, His heart rate did go up some with orthostatic vital signs. No evidence of AV conduction disease. Recommend lifestyle change to try to eliminate significant caffeine use, increase fluid or water hydration. Continue to monitor for any recurrence and follow-up in the office. Recommend echocardiogram. Patient verbalized understanding and agreed with plan.  Tobacco use We discussed  the importance of smoking cessation and different strategies for quitting.    Current medicines are reviewed at length with the patient today.  The patient does not have concerns regarding medicines.  Labs/ tests ordered today include:  Orders Placed This Encounter  Procedures  . EKG 12-Lead  . ECHOCARDIOGRAM COMPLETE    I had a lengthy and detailed discussion with the patient regarding diagnoses, prognosis, diagnostic options, treatment options , and side effects of medications.   I counseled the patient on importance of lifestyle modification including heart healthy diet, regular physical activity , and smoking cessation.  I spent at least 60 minutes with the patient today and more than 50% of the time was spent counseling the patient and coordinating care.    Disposition:   FU with undersigned after tests    Signed, Almond Lint, MD  09/03/2016 4:32 PM    [facility]  This note was generated in part with voice recognition software and I apologize for any typographical errors that were not detected and corrected.

## 2016-09-03 NOTE — Patient Instructions (Addendum)
Testing/Procedures: Your physician has requested that you have an echocardiogram. Echocardiography is a painless test that uses sound waves to create images of your heart. It provides your doctor with information about the size and shape of your heart and how well your heart's chambers and valves are working. This procedure takes approximately one hour. There are no restrictions for this procedure.    Follow-Up: Your physician recommends that you schedule a follow-up appointment in: 6 weeks with Dr. Alvino ChapelIngal.   It was a pleasure seeing you today here in the office. Please do not hesitate to give us a call back if you have any further questions. [phone]  Weddington CellarPamela A. RN, BSN     Echocardiogram An echocardiogram, or echocardiography, uses sound waves (ultrasound) to produce an image of your heart. The echocardiogram is simple, painless, obtained within a short period of time, and offers valuable information to your health care provider. The images from an echocardiogram can provide information such as:  Evidence of coronary artery disease (CAD).  Heart size.  Heart muscle function.  Heart valve function.  Aneurysm detection.  Evidence of a past heart attack.  Fluid buildup around the heart.  Heart muscle thickening.  Assess heart valve function. LET [facility] PROVIDER KNOW ABOUT:  Any allergies you have.  All medicines you are taking, including vitamins, herbs, eye drops, creams, and over-the-counter medicines.  Previous problems you or members of your family have had with the use of anesthetics.  Any blood disorders you have.  Previous surgeries you have had.  Medical conditions you have.  Possibility of pregnancy, if this applies. BEFORE THE PROCEDURE  No special preparation is needed. Eat and drink normally.  PROCEDURE   In order to produce an image of your heart, gel will be applied to your chest and a wand-like tool (transducer) will be moved over your  chest. The gel will help transmit the sound waves from the transducer. The sound waves will harmlessly bounce off your heart to allow the heart images to be captured in real-time motion. These images will then be recorded.  You may need an IV to receive a medicine that improves the quality of the pictures. AFTER THE PROCEDURE You may return to your normal schedule including diet, activities, and medicines, unless your health care provider tells you otherwise.   This information is not intended to replace advice given to you by your health care provider. Make sure you discuss any questions you have with your health care provider.   Document Released: 10/16/2000 Document Revised: 11/09/2014 Document Reviewed: 06/26/2013 Elsevier Interactive Patient Education [employer]2016 Elsevier Inc.

## 2016-09-17 ENCOUNTER — Encounter: Payer: Self-pay | Admitting: *Deleted

## 2016-09-17 ENCOUNTER — Other Ambulatory Visit: Payer: Self-pay

## 2016-10-08 ENCOUNTER — Ambulatory Visit: Payer: Self-pay | Admitting: Cardiology

## 2016-10-09 ENCOUNTER — Encounter: Payer: Self-pay | Admitting: Cardiology

## 2017-01-27 ENCOUNTER — Encounter: Payer: Self-pay | Admitting: Intensive Care

## 2017-01-27 ENCOUNTER — Emergency Department
Admission: EM | Admit: 2017-01-27 | Discharge: 2017-01-27 | Disposition: A | Discharge status: Home / Self Care | Payer: Self-pay | Attending: Emergency Medicine | Admitting: Emergency Medicine

## 2017-01-27 DIAGNOSIS — F17210 Nicotine dependence, cigarettes, uncomplicated: Secondary | ICD-10-CM | POA: Insufficient documentation

## 2017-01-27 DIAGNOSIS — L0390 Cellulitis, unspecified: Secondary | ICD-10-CM

## 2017-01-27 DIAGNOSIS — L03012 Cellulitis of left finger: Secondary | ICD-10-CM | POA: Insufficient documentation

## 2017-01-27 DIAGNOSIS — B078 Other viral warts: Secondary | ICD-10-CM | POA: Insufficient documentation

## 2017-01-27 MED ORDER — IBUPROFEN 800 MG PO TABS: 800.0000 mg | ORAL_TABLET | Freq: Three times a day (TID) | ORAL | 0 refills | Status: DC | PRN

## 2017-01-27 MED ORDER — CEPHALEXIN 500 MG PO CAPS: 500.0000 mg | ORAL_CAPSULE | Freq: Four times a day (QID) | ORAL | 0 refills | Status: AC

## 2017-01-27 MED ORDER — SALICYLIC ACID 27.5 % EX LIQD: 5.0000 [IU] | Freq: Two times a day (BID) | CUTANEOUS | 0 refills | Status: DC

## 2017-01-27 NOTE — ED Provider Notes (Signed)
[facility] Emergency Department Provider Note  ____________________________________________  Time seen: Approximately 4:00 PM  I have reviewed the triage vital signs and the nursing notes.   HISTORY  Chief Complaint Verrucous Vulgaris    HPI Marcus Hill is a 26 y.o. male that presents to the emergency department with wart on left thumb for one year. Patient states that the last couple of days, thumb has started to swell and has increased in pain. Patient states the pain feels throbbing in character. He took an ibuprofen last night for pain, which helped. He has been using over-the-counter drops on wart from [employer]. He denies fever, shortness breath, chest pain, nausea, vomiting, abdominal pain.   Past Medical History:  Diagnosis Date  . Cold   . Esophageal hernia   . GERD (gastroesophageal reflux disease)   . MRSA infection     Patient Active Problem List   Diagnosis Date Noted  . Hiatal hernia 01/07/2016  . Loss of weight   . Diarrhea   . Psychogenic vomiting with nausea   . Nausea and vomiting 12/04/2015  . Left upper quadrant pain 10/31/2015  . Heartburn 10/31/2015  . Pain, dental 10/31/2015  . Chronic diarrhea 10/17/2015  . Abnormal weight loss 10/17/2015  . Tobacco abuse 10/17/2015    Past Surgical History:  Procedure Laterality Date  . COLONOSCOPY WITH PROPOFOL N/A 12/09/2015   Procedure: COLONOSCOPY WITH PROPOFOL;  Surgeon: Midge Minium, MD;  [facility];  Service: Endoscopy;  Laterality: N/A;  . ESOPHAGOGASTRODUODENOSCOPY (EGD) WITH PROPOFOL N/A 12/09/2015   Procedure: ESOPHAGOGASTRODUODENOSCOPY (EGD) WITH PROPOFOL;  Surgeon: Midge Minium, MD;  Location[facility];  Service: Endoscopy;  Laterality: N/A;    Prior to Admission medications   Medication Sig Start Date End Date Taking? Authorizing Provider  cephALEXin (KEFLEX) 500 MG capsule Take 1 capsule (500 mg total) by mouth 4 (four) times daily. 01/27/17  02/06/17  Enid Derry, PA-C  ibuprofen (ADVIL,MOTRIN) 800 MG tablet Take 1 tablet (800 mg total) by mouth every 8 (eight) hours as needed. 01/27/17   Enid Derry, PA-C  Salicylic Acid (SALICYLIC ACID WART REMOVER) 27.5 % LIQD Apply 5 Units topically 2 (two) times daily. 01/27/17   Enid Derry, PA-C    Allergies Patient has no known allergies.  Family History  Problem Relation Age of Onset  . Hypertension Mother   . Hyperlipidemia Mother   . Diabetes Father   . Heart disease Father   . Heart attack Father   . Diabetes Maternal Grandmother   . Diabetes Maternal Grandfather   . Diabetes Paternal Grandmother   . Heart attack Paternal Grandmother   . Diabetes Paternal Grandfather   . Heart attack Paternal Grandfather   . Cancer Neg Hx   . COPD Neg Hx   . Stroke Neg Hx     Social History Social History  Substance Use Topics  . Smoking status: Current Every Day Smoker    Packs/day: 1.00    Years: 10.00    Types: Cigarettes  . Smokeless tobacco: Never Used  . Alcohol use No     Review of Systems  Constitutional: No fever/chills Cardiovascular: No chest pain. Respiratory: No cough. No SOB. Gastrointestinal: No abdominal pain.  No nausea, no vomiting.  Musculoskeletal: Positive for thumb pain Skin: Negative for abrasions, lacerations, ecchymosis. Neurological: Negative for headaches, numbness or tingling   ____________________________________________   PHYSICAL EXAM:  VITAL SIGNS: ED Triage Vitals  Enc Vitals Group     BP 01/27/17  1507 127/62     Pulse Rate 01/27/17 1507 93     Resp 01/27/17 1507 15     Temp 01/27/17 1507 98.3 F (36.8 C)     Temp Source 01/27/17 1507 Oral     SpO2 01/27/17 1507 97 %     Weight 01/27/17 1509 160 lb (72.6 kg)     Height 01/27/17 1509 5\' 9"  (1.753 m)     Head Circumference --      Peak Flow --      Pain Score 01/27/17 1506 8     Pain Loc --      Pain Edu? --      Excl. in GC? --      Constitutional: Alert and oriented.  Well appearing and in no acute distress. Eyes: Conjunctivae are normal. PERRL. EOMI. Head: Atraumatic. ENT:      Ears:      Nose: No congestion/rhinnorhea.      Mouth/Throat: Mucous membranes are moist.  Neck: No stridor.  Cardiovascular: Normal rate, regular rhythm.  Good peripheral circulation. Respiratory: Normal respiratory effort without tachypnea or retractions. Lungs CTAB. Good air entry to the bases with no decreased or absent breath sounds. Musculoskeletal: Full range of motion to all extremities. No gross deformities appreciated. Tenderness to palpation over left thumb.  Neurologic:  Normal speech and language. No gross focal neurologic deficits are appreciated.  Skin:  Skin is warm, dry. 2 mm rough, hard papule to left thumb. Surrounding erythema and swelling.    ____________________________________________   LABS (all labs ordered are listed, but only abnormal results are displayed)  Labs Reviewed - No data to display ____________________________________________  EKG   ____________________________________________  RADIOLOGY  No results found.  ____________________________________________    PROCEDURES  Procedure(s) performed:    Procedures    Medications - No data to display   ____________________________________________   INITIAL IMPRESSION / ASSESSMENT AND PLAN / ED COURSE  Pertinent labs & imaging results that were available during my care of the patient were reviewed by me and considered in my medical decision making (see chart for details).  Review of the [place] CSRS was performed in accordance of the [facility] prior to dispensing any controlled drugs.     Patient's diagnosis is consistent with verrucous vulgaris and cellulitis. Vital signs and exam are reassuring. Patient was referred to PCP for wart removal with liquid nitrogen. Patient will be discharged home with prescriptions for Keflex,  salicylic acid wart remover, and ibuprofen. Patient is  given ED precautions to return to the ED for any worsening or new symptoms.     ____________________________________________  FINAL CLINICAL IMPRESSION(S) / ED DIAGNOSES  Final diagnoses:  Other viral warts  Cellulitis, unspecified cellulitis site      NEW MEDICATIONS STARTED DURING THIS VISIT:  Discharge Medication List as of 01/27/2017  4:04 PM    START taking these medications   Details  cephALEXin (KEFLEX) 500 MG capsule Take 1 capsule (500 mg total) by mouth 4 (four) times daily., Starting Wed 01/27/2017, Until Sat 02/06/2017, Print    ibuprofen (ADVIL,MOTRIN) 800 MG tablet Take 1 tablet (800 mg total) by mouth every 8 (eight) hours as needed., Starting Wed 01/27/2017, Print    Salicylic Acid (SALICYLIC ACID WART REMOVER) 27.5 % LIQD Apply 5 Units topically 2 (two) times daily., Starting Wed 01/27/2017, Print            This chart was dictated using voice recognition software/Dragon. Despite best efforts to proofread, errors can occur  which can change the meaning. Any change was purely unintentional.    Enid Derry, PA-C 01/27/17 1752    Myrna Blazer, MD 01/27/17 [phone]

## 2017-01-27 NOTE — ED Triage Notes (Signed)
Patient arrived by POV for L hand, thumb pain. Patient has wart present on L hand thumb and stated "I work [employer] and I think it got tore off at some point. My thumb recently started swelling and I have pain that goes up past my wrist"

## 2017-05-09 IMAGING — CT CT ABD-PELV W/ CM
2 of 4 series · 16 of 46 positions shown, 18 images · IV contrast (omnipaque)
Comparison: None.

CLINICAL DATA: Nausea, vomiting and diarrhea, worsening over the
past 2 months. Lost approximately 30 pounds.

EXAM:
CT ABDOMEN AND PELVIS WITH CONTRAST
TECHNIQUE: Multidetector CT imaging of the abdomen and pelvis was performed
using the standard protocol following bolus administration of
intravenous contrast.
CONTRAST:  100mL OMNIPAQUE IOHEXOL 300 MG/ML  SOLN

[Series 2: routine with · axial · 0.69mm/px · z∈[-1039,-584]mm · 13 of 99 slices shown, 15 images]
[im 4/99  soft-tissue]
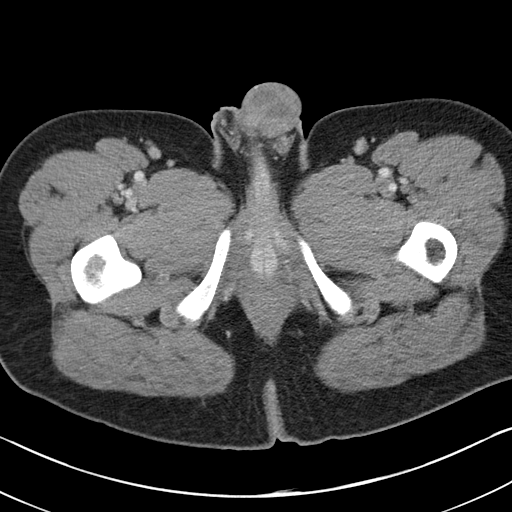
[im 4/99  bone]
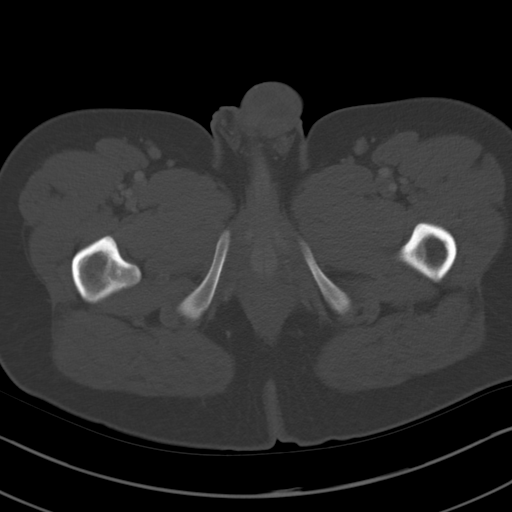
[im 12/99  soft-tissue]
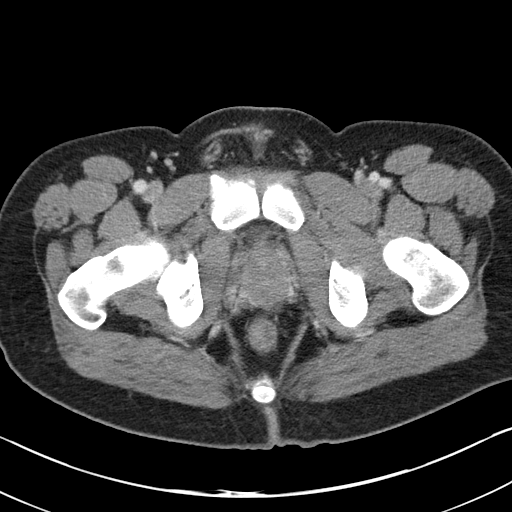
[im 20/99  soft-tissue]
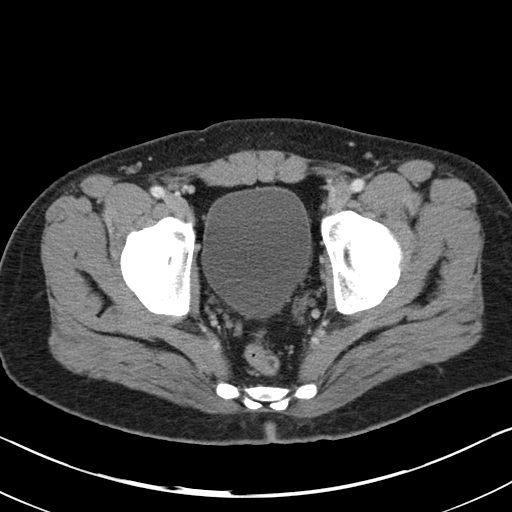
[im 28/99  soft-tissue]
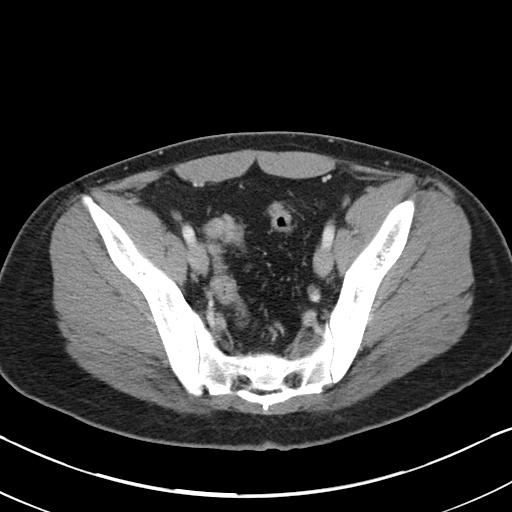
[im 36/99  soft-tissue]
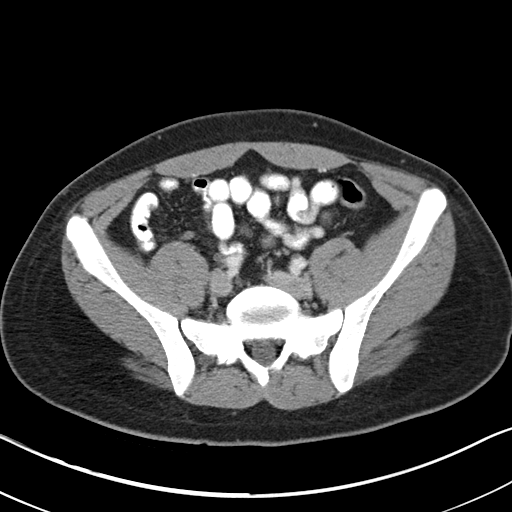
[im 44/99  soft-tissue]
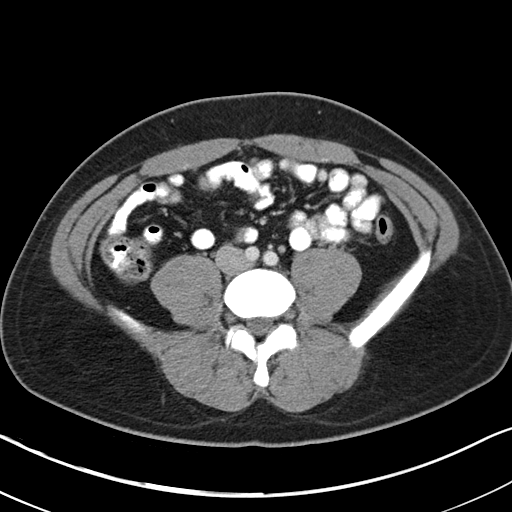
[im 51/99  soft-tissue]
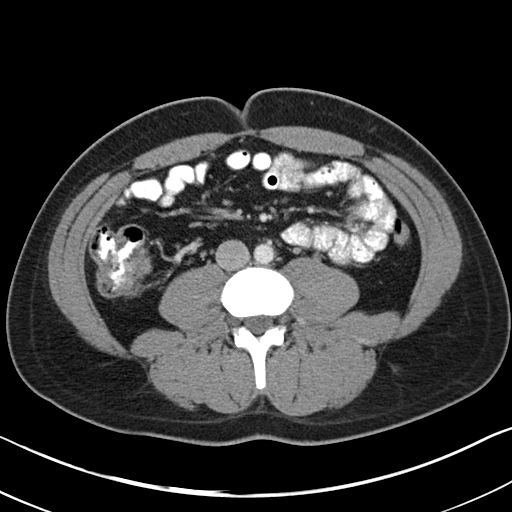
[im 55/99  soft-tissue]
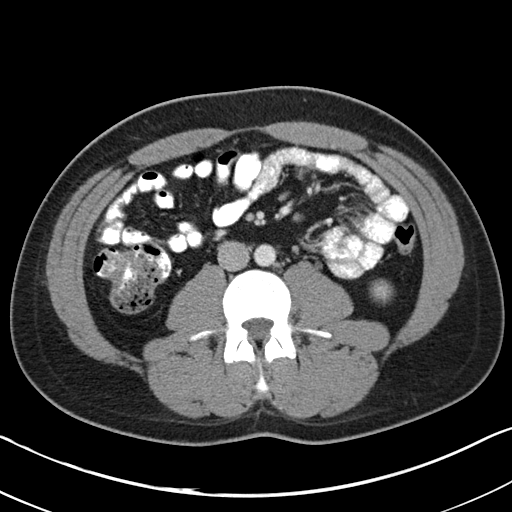
[im 63/99  soft-tissue]
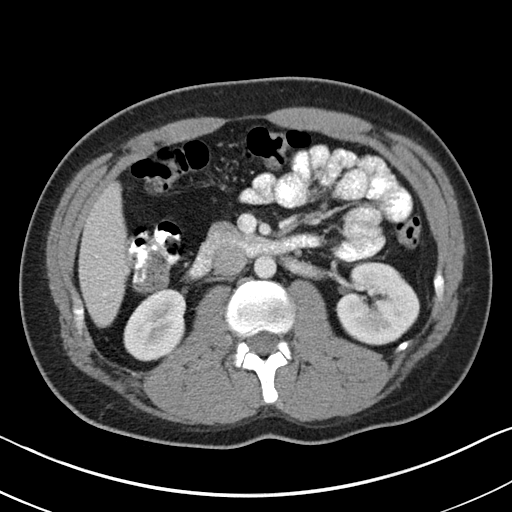
[im 63/99  bone]
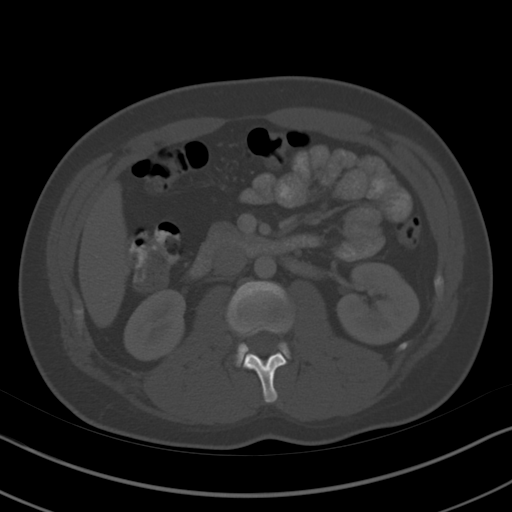
[im 71/99  soft-tissue]
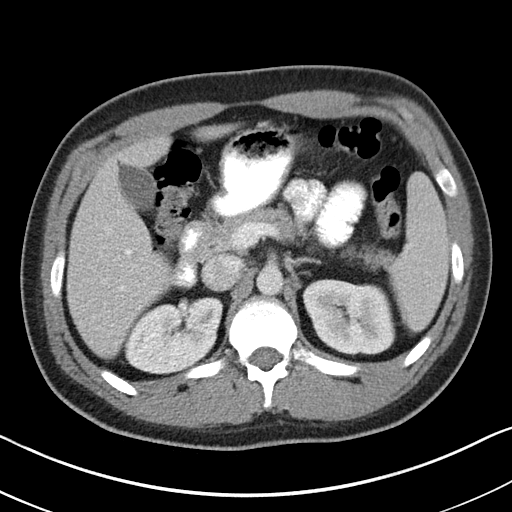
[im 79/99  soft-tissue]
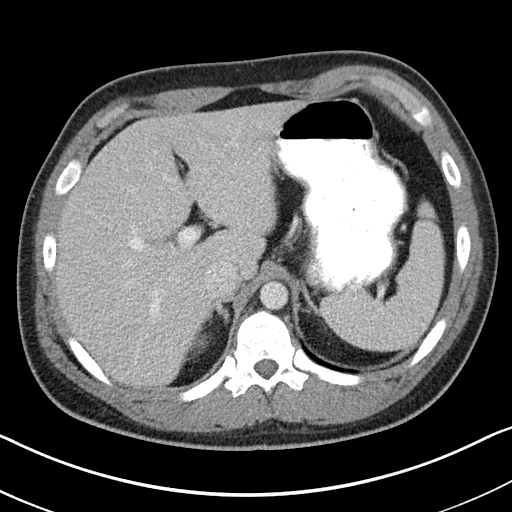
[im 87/99  soft-tissue]
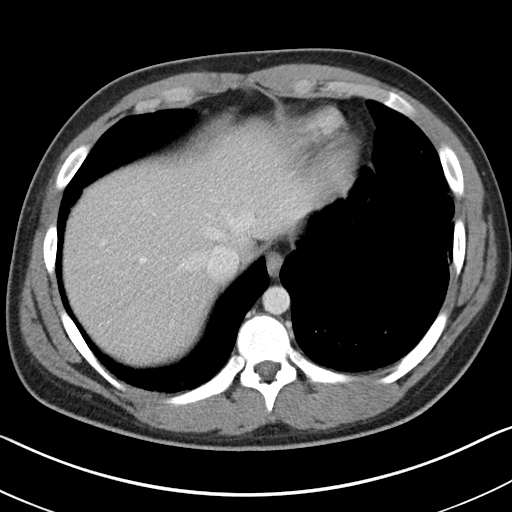
[im 95/99  soft-tissue]
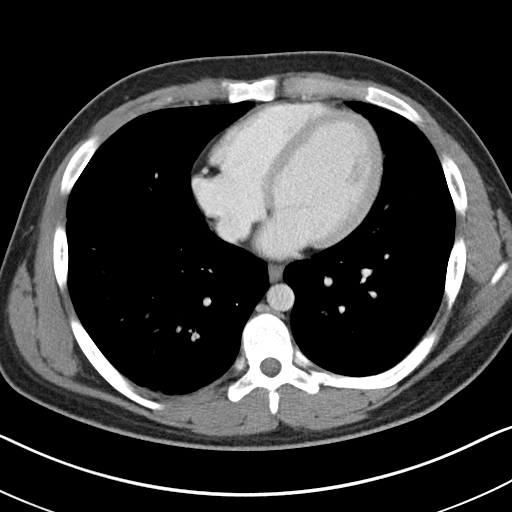

[Series 5: cor routine with · coronal · 0.73mm/px · 3 of 151 slices shown]
[im 51/151  soft-tissue]
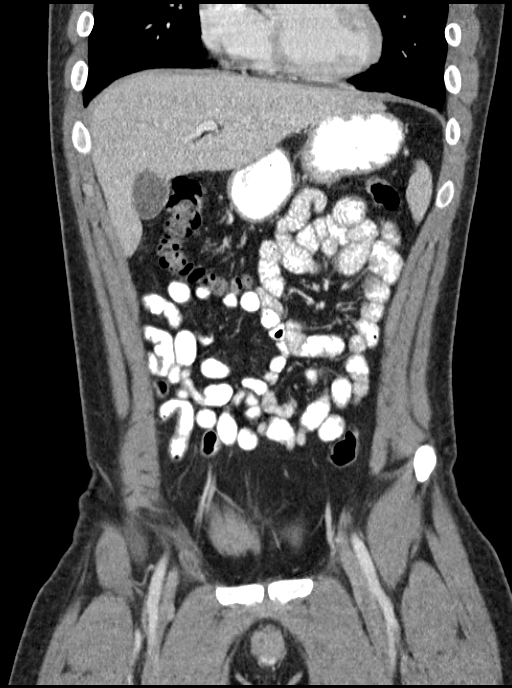
[im 67/151  soft-tissue]
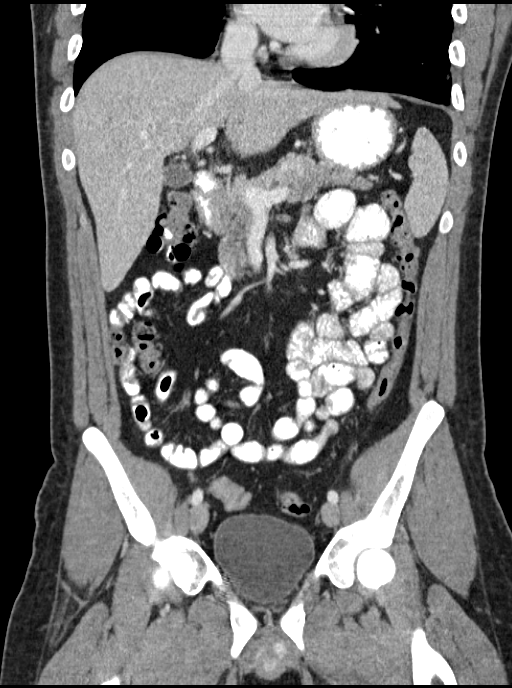
[im 84/151  soft-tissue]
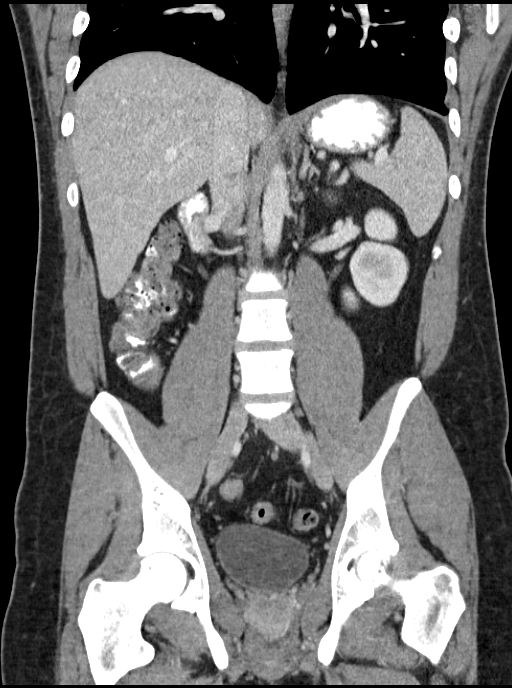

[16 of 46 positions shown; findings below may reference images not displayed]

FINDINGS: Lower chest:  Minimal dependent atelectasis at both lung bases.

Hepatobiliary: No masses or other significant abnormality.

Pancreas: No mass, inflammatory changes, or other significant
abnormality.

Spleen: Within normal limits in size and appearance.

Adrenals/Urinary Tract: No masses identified. No evidence of
hydronephrosis.

Stomach/Bowel: No evidence of obstruction, inflammatory process, or
abnormal fluid collections.

Vascular/Lymphatic: No pathologically enlarged lymph nodes. No
evidence of abdominal aortic aneurysm.

Reproductive: No mass or other significant abnormality.

Other: None.

Musculoskeletal:  No suspicious bone lesions identified.
IMPRESSION: No significant abnormality.

## 2018-03-22 ENCOUNTER — Encounter: Payer: Self-pay | Admitting: Emergency Medicine

## 2018-03-22 ENCOUNTER — Emergency Department
Admission: EM | Admit: 2018-03-22 | Discharge: 2018-03-22 | Disposition: A | Discharge status: Home / Self Care | Payer: Self-pay | Attending: Emergency Medicine | Admitting: Emergency Medicine

## 2018-03-22 DIAGNOSIS — F17210 Nicotine dependence, cigarettes, uncomplicated: Secondary | ICD-10-CM | POA: Insufficient documentation

## 2018-03-22 DIAGNOSIS — K0889 Other specified disorders of teeth and supporting structures: Secondary | ICD-10-CM | POA: Insufficient documentation

## 2018-03-22 MED ORDER — AMOXICILLIN 500 MG PO CAPS: 500.0000 mg | ORAL_CAPSULE | Freq: Three times a day (TID) | ORAL | 0 refills | Status: AC

## 2018-03-22 NOTE — Discharge Instructions (Signed)
OPTIONS FOR DENTAL FOLLOW UP CARE ° °[place] Department of Health and Human Services - Local Safety Net Dental Clinics °http://www.ncdhhs.gov/dph/oralhealth/services/safetynetclinics.htm °  °[facility] (336-562-3123) ° °[facility] ([phone]) ° °[facility] ([phone]) ° °[facility] ([phone]) ° °[facility] ([phone]) °This clinic caters to the indigent population and is on a lottery system. °Location: °[facility], [address], [place] °Clinic Hours: °Wednesdays from 6pm - 9pm, patients seen by a lottery system. °For dates, call or go to [email or web address].edu/[facility]/patients/[email or web address]-[facility] °Services: °Cleanings, fillings and simple extractions. °Payment Options: °DENTAL WORK IS FREE OF CHARGE. Bring proof of income or support. °Best way to get seen: °Arrive at 5:15 pm - this is a lottery, NOT first come/first serve, so arriving earlier will not increase your chances of being seen. °  °  °[facility] °[phone] °Select option 1 for emergencies °  °Location: °[facility], [address], [place] °Clinic Hours: °No walk-ins accepted - call the day before to schedule an appointment. °Check in times are 9:30 am and 1:30 pm. °Services: °Simple extractions, temporary fillings, pulpectomy/pulp debridement, uncomplicated abscess drainage. °Payment Options: °PAYMENT IS DUE AT THE TIME OF SERVICE.  Fee is usually $100-200, additional surgical procedures (e.g. abscess drainage) may be extra. °Cash, checks, Visa/MasterCard accepted.  Can file Medicaid if patient is covered for dental - patient should call case worker to check. °No discount for [facility] patients. °Best way to get seen: °MUST call the day before and get onto the schedule. Can usually be seen the next 1-2 days. No walk-ins accepted. °  °  °[facility] °[phone] °   °Location: °[facility], [address], [place] °Clinic Hours: °M, W, Th, F 8am or 1:30pm, Tues 9a or 1:30 - first come/first served. °Services: °Simple extractions, temporary fillings, uncomplicated abscess drainage.  You do not need to be an [place] resident. °Payment Options: °PAYMENT IS DUE AT THE TIME OF SERVICE. °Dental insurance, otherwise sliding scale - bring proof of income or support. °Depending on income and treatment needed, cost is usually $50-200. °Best way to get seen: °Arrive early as it is first come/first served. °  °  °[facility] °[phone] °  °Location: °[address] °Clinic Hours: °Mon-Thu 8a-5p °Services: °Most basic dental services including extractions and fillings. °Payment Options: °PAYMENT IS DUE AT THE TIME OF SERVICE. °Sliding scale, up to 50% off - bring proof if income or support. °Medicaid with dental option accepted. °Best way to get seen: °Call to schedule an appointment, can usually be seen within 2 weeks OR they will try to see walk-ins - show up at 8a or 2p (you may have to wait). °  °  °[facility] °[phone] °[place] RESIDENTS ONLY °  °Location: °[facility], [address], [place], [place] [address] °Clinic Hours: By appointment only. °Monday - Thursday 8am-5pm, Friday 8am-12pm °Services: Cleanings, fillings, extractions. °Payment Options: °PAYMENT IS DUE AT THE TIME OF SERVICE. °Cash, Visa or MasterCard. Sliding scale - $30 minimum per service. °Best way to get seen: °Come in to office, complete packet and make an appointment - need proof of income °or support monies for each household member and proof of [place] residence. °Usually takes about a month to get in. °  °  °[facility] °[phone] °  °Location: °[address]   [place] °Clinic Hours: Walk-in Urgent Care Dental Services are offered Monday-Friday  mornings only. °The numbers of emergencies accepted daily is limited to the number of °providers available. °Maximum 15 - Mondays, Wednesdays & Thursdays °Maximum 10 - Tuesdays & Fridays °Services: °You do not need to be a [place] resident to be seen for a dental emergency. °Emergencies are defined as pain, swelling, abnormal bleeding, or dental trauma. Walkins will receive x-rays if needed. °NOTE: Dental cleaning is not an emergency. °Payment Options: °PAYMENT IS DUE AT THE TIME OF SERVICE. °Minimum co-pay is $40.00 for uninsured patients. °Minimum co-pay is $3.00 for Medicaid with dental coverage. °Dental Insurance is accepted and must be presented at time of visit. °Medicare does not cover dental. °Forms of payment: Cash, credit card, checks. °Best way to get seen: °If not previously registered with the clinic, walk-in dental registration begins at 7:15 am and is on a first come/first serve basis. °If previously registered with the clinic, call to make an appointment. °  °  °[facility] °[phone] °[place] RESIDENTS ONLY °  °Location: °[address], [place], [place] °Clinic Hours: °Mon-Thu 10a-2p °Services: Extractions only! °Payment Options: °FREE (donations accepted) - bring proof of income or support °Best way to get seen: °Call and schedule an appointment OR come at 8am on the 1st Monday of every month (except for holidays) when it is first come/first served. °  °  °[facility] °[phone] °  °Location: °[address], [place] °Clinic Hours: °Friday mornings °Services, Payment Options, Best way to get seen: °Call for info °

## 2018-03-22 NOTE — ED Provider Notes (Signed)
[facility] Emergency Department Provider Note  ____________________________________________  Time seen: Approximately 9:44 PM  I have reviewed the triage vital signs and the nursing notes.   HISTORY  Chief Complaint Dental Pain    HPI Marcus Hill is a 27 y.o. male presents to the emergency department with a broken superior one.  Patient reports that superior one has been broken for the past 2 months.  Patient does not currently have an appointment with a local dentist.  He is presenting to the emergency department for pain management.  He currently rates his pain at 10 out of 10 in intensity and describes it as throbbing.   Past Medical History:  Diagnosis Date  . Cold   . Esophageal hernia   . GERD (gastroesophageal reflux disease)   . MRSA infection     Patient Active Problem List   Diagnosis Date Noted  . Hiatal hernia 01/07/2016  . Loss of weight   . Diarrhea   . Psychogenic vomiting with nausea   . Nausea and vomiting 12/04/2015  . Left upper quadrant pain 10/31/2015  . Heartburn 10/31/2015  . Pain, dental 10/31/2015  . Chronic diarrhea 10/17/2015  . Abnormal weight loss 10/17/2015  . Tobacco abuse 10/17/2015    Past Surgical History:  Procedure Laterality Date  . COLONOSCOPY WITH PROPOFOL N/A 12/09/2015   Procedure: COLONOSCOPY WITH PROPOFOL;  Surgeon: Midge Minium, MD;  Location: [facility];  Service: Endoscopy;  Laterality: N/A;  . ESOPHAGOGASTRODUODENOSCOPY (EGD) WITH PROPOFOL N/A 12/09/2015   Procedure: ESOPHAGOGASTRODUODENOSCOPY (EGD) WITH PROPOFOL;  Surgeon: Midge Minium, MD;  Location: [facility];  Service: Endoscopy;  Laterality: N/A;    Prior to Admission medications   Medication Sig Start Date End Date Taking? Authorizing Provider  amoxicillin (AMOXIL) 500 MG capsule Take 1 capsule (500 mg total) by mouth 3 (three) times daily for 10 days. 03/22/18 04/01/18  Orvil Feil, PA-C  ibuprofen (ADVIL,MOTRIN) 800  MG tablet Take 1 tablet (800 mg total) by mouth every 8 (eight) hours as needed. 01/27/17   Enid Derry, PA-C  Salicylic Acid (SALICYLIC ACID WART REMOVER) 27.5 % LIQD Apply 5 Units topically 2 (two) times daily. 01/27/17   Enid Derry, PA-C    Allergies Patient has no known allergies.  Family History  Problem Relation Age of Onset  . Hypertension Mother   . Hyperlipidemia Mother   . Diabetes Father   . Heart disease Father   . Heart attack Father   . Diabetes Maternal Grandmother   . Diabetes Maternal Grandfather   . Diabetes Paternal Grandmother   . Heart attack Paternal Grandmother   . Diabetes Paternal Grandfather   . Heart attack Paternal Grandfather   . Cancer Neg Hx   . COPD Neg Hx   . Stroke Neg Hx     Social History Social History   Tobacco Use  . Smoking status: Current Every Day Smoker    Packs/day: 1.00    Years: 10.00    Pack years: 10.00    Types: Cigarettes  . Smokeless tobacco: Never Used  Substance Use Topics  . Alcohol use: No  . Drug use: Yes    Types: Marijuana    Comment: 12 yrs     Review of Systems  Constitutional: No fever/chills Eyes: No visual changes. No discharge ENT: Patient has dental pain.  Cardiovascular: no chest pain. Respiratory: no cough. No SOB. Gastrointestinal: No abdominal pain.  No nausea, no vomiting.  No diarrhea.  No constipation.  Musculoskeletal: Negative for musculoskeletal pain. Skin: Negative for rash, abrasions, lacerations, ecchymosis. Neurological: Negative for headaches, focal weakness or numbness.   ____________________________________________   PHYSICAL EXAM:  VITAL SIGNS: ED Triage Vitals  Enc Vitals Group     BP 03/22/18 1854 127/75     Pulse Rate 03/22/18 1854 (!) 52     Resp 03/22/18 1854 16     Temp 03/22/18 1854 97.7 F (36.5 C)     Temp Source 03/22/18 1854 Oral     SpO2 03/22/18 1854 100 %     Weight 03/22/18 1855 170 lb (77.1 kg)     Height 03/22/18 1855  (1.727 m)     Head  Circumference --      Peak Flow --      Pain Score 03/22/18 1855 10     Pain Loc --      Pain Edu? --      Excl. in GC? --      Constitutional: Alert and oriented. Well appearing and in no acute distress. Eyes: Conjunctivae are normal. PERRL. EOMI. Head: Atraumatic. ENT:      Nose: No congestion/rhinnorhea.      Mouth/Throat: Mucous membranes are moist.  Patient has broken superior one. Cardiovascular: Normal rate, regular rhythm. Normal S1 and S2.  Good peripheral circulation. Respiratory: Normal respiratory effort without tachypnea or retractions. Lungs CTAB. Good air entry to the bases with no decreased or absent breath sounds. Skin:  Skin is warm, dry and intact. No rash noted.  ____________________________________________   LABS (all labs ordered are listed, but only abnormal results are displayed)  Labs Reviewed - No data to display ____________________________________________  EKG   ____________________________________________  RADIOLOGY  No results found.  ____________________________________________    PROCEDURES  Procedure(s) performed:    Procedures    Medications - No data to display   ____________________________________________   INITIAL IMPRESSION / ASSESSMENT AND PLAN / ED COURSE  Pertinent labs & imaging results that were available during my care of the patient were reviewed by me and considered in my medical decision making (see chart for details).  Review of the [place] CSRS was performed in accordance of the [facility] prior to dispensing any controlled drugs.    Assessment and plan Dental pain Patient presents to the emergency department with a broken superior one.  Patient was treated empirically with meloxicam and amoxicillin.  He was advised to make an appointment with a local dentist as soon as possible.     ____________________________________________  FINAL CLINICAL IMPRESSION(S) / ED DIAGNOSES  Final diagnoses:  Pain, dental       NEW MEDICATIONS STARTED DURING THIS VISIT:  ED Discharge Orders        Ordered    amoxicillin (AMOXIL) 500 MG capsule  3 times daily     03/22/18 2048          This chart was dictated using voice recognition software/Dragon. Despite best efforts to proofread, errors can occur which can change the meaning. Any change was purely unintentional.    Orvil Feil, PA-C 03/22/18 2149    Minna Antis, MD 03/22/18 2240

## 2018-03-22 NOTE — ED Triage Notes (Signed)
Patient presents to the ED with dental pain.  Patient states he has been in pain for approx. 2 months.  Patient states he has a hole in his tooth on the right side.  Patient states, "A dentist cleaned it out one time but I think now it's abscessed or something."

## 2020-01-24 ENCOUNTER — Emergency Department
Admission: EM | Admit: 2020-01-24 | Discharge: 2020-01-24 | Disposition: A | Discharge status: Home / Self Care | Payer: Self-pay | Attending: Emergency Medicine | Admitting: Emergency Medicine

## 2020-01-24 ENCOUNTER — Encounter: Payer: Self-pay | Admitting: Emergency Medicine

## 2020-01-24 ENCOUNTER — Other Ambulatory Visit: Payer: Self-pay

## 2020-01-24 DIAGNOSIS — F1210 Cannabis abuse, uncomplicated: Secondary | ICD-10-CM | POA: Insufficient documentation

## 2020-01-24 DIAGNOSIS — K0889 Other specified disorders of teeth and supporting structures: Secondary | ICD-10-CM | POA: Insufficient documentation

## 2020-01-24 DIAGNOSIS — F17210 Nicotine dependence, cigarettes, uncomplicated: Secondary | ICD-10-CM | POA: Insufficient documentation

## 2020-01-24 MED ORDER — IBUPROFEN 600 MG PO TABS: 600.0000 mg | ORAL_TABLET | Freq: Three times a day (TID) | ORAL | 0 refills | Status: DC | PRN

## 2020-01-24 MED ORDER — AMOXICILLIN 875 MG PO TABS: 875.0000 mg | ORAL_TABLET | Freq: Two times a day (BID) | ORAL | 0 refills | Status: DC

## 2020-01-24 NOTE — ED Triage Notes (Signed)
Patient presents to the ED with right sided dental pain.  Patient states, "I've been dealing with it for a long time and I can tell it's inflamed.  I just need antibiotics and a referral to a dentist if possible."  Patient states, "I feel like I've been punched by Marcus Hill."  Patient is in no obvious distress at this time.

## 2020-01-24 NOTE — Discharge Instructions (Addendum)
Begin taking antibiotics and ibuprofen today and continue taking antibiotics until completely finished.  A list of dental clinics is listed on your discharge papers.  Most of these clinics are on a sliding scale based on your income.  Also consider getting an plastic mouthguard to wear at night to protect your teeth from further damage when you are grinding your teeth.  OPTIONS FOR DENTAL FOLLOW UP CARE  [facility] [place] [email or web address] [phone])  [facility] [phone])  [place] [phone])  [place] [phone])  [facility] [phone]) This clinic caters to the indigent population and is on a lottery system. Location: [facility] [place], [place], [place] Clinic Hours: Wednesdays from 6pm - 9pm, patients seen by a lottery system. For dates, call or go to [email or web address] Services: Cleanings, fillings and simple extractions. Payment Options: DENTAL WORK IS FREE OF CHARGE. Bring proof of income or support. Best way to get seen: Arrive at 5:15 pm - this is a lottery, NOT first come/first serve, so arriving earlier will not increase your chances of being seen.     [facility] [phone] Select option 1 for emergencies   Location: [facility], [place], [address] [place] Clinic Hours: No walk-ins accepted - call the day before to schedule an appointment. Check in times are 9:30 am and 1:30 pm. Services: Simple extractions, temporary fillings, pulpectomy/pulp debridement, uncomplicated abscess drainage. Payment Options: PAYMENT IS DUE AT THE TIME OF SERVICE.  Fee is usually $100-200, additional surgical procedures (e.g. abscess drainage) may be  extra. Cash, checks, Visa/MasterCard accepted.  Can file Medicaid if patient is covered for dental - patient should call case worker to check. No discount for [facility] patients. Best way to get seen: MUST call the day before and get onto the schedule. Can usually be seen the next 1-2 days. No walk-ins accepted.     [place] [phone]   Location: [place], [place] Clinic Hours: M, W, Th, F 8am or 1:30pm, Tues 9a or 1:30 - first come/first served. Services: Simple extractions, temporary fillings, uncomplicated abscess drainage.  You do not need to be an [facility] resident. Payment Options: PAYMENT IS DUE AT THE TIME OF SERVICE. Dental insurance, otherwise sliding scale - bring proof of income or support. Depending on income and treatment needed, cost is usually $50-200. Best way to get seen: Arrive early as it is first come/first served.     [facility] [phone]   Location: [place] [facility] Hours: Mon-Thu 8a-5p Services: Most basic dental services including extractions and fillings. Payment Options: PAYMENT IS DUE AT THE TIME OF SERVICE. Sliding scale, up to 50% off - bring proof if income or support. Medicaid with dental option accepted. Best way to get seen: Call to schedule an appointment, can usually be seen within 2 weeks OR they will try to see walk-ins - show up at [place] or 2p (you may have to wait).     [facility] [place] RESIDENTS ONLY   Location: [facility], [place] [address] [place], [place] [address] Clinic Hours: By appointment only. Monday - Thursday 8am-5pm, Friday 8am-12pm Services: Cleanings, fillings, extractions. Payment Options: PAYMENT IS DUE AT THE TIME OF SERVICE. Cash, Visa or MasterCard.  Sliding scale - $30 minimum per service. Best way to get seen: Come in to office,  complete packet and make an appointment - need proof of income or support monies for each household member and proof of [facility] residence. Usually takes about a month to get in.     [facility] [phone]   Location: [address], [facility] Clinic Hours: Walk-in Urgent Care Dental Services are offered Monday-Friday mornings only. The numbers of emergencies accepted daily is limited to the number of providers available. Maximum 15 - Mondays, Wednesdays & Thursdays Maximum 10 - Tuesdays & Fridays Services: You do not need to be a [place] [facility] resident to be seen for a dental emergency. Emergencies are defined as pain, swelling, abnormal bleeding, or dental trauma. Walkins will receive x-rays if needed. NOTE: Dental cleaning is not an emergency. Payment Options: PAYMENT IS DUE AT THE TIME OF SERVICE. Minimum co-pay is $40.00 for uninsured patients. Minimum co-pay is $3.00 for Medicaid with dental coverage. Dental Insurance is accepted and must be presented at time of visit. Medicare does not cover dental. Forms of payment: Cash, credit card, checks. Best way to get seen: If not previously registered with the clinic, walk-in dental registration begins at 7:15 am and is on a first come/first serve basis. If previously registered with the clinic, call to make an appointment.     [facility] [phone] [place] RESIDENTS ONLY   Location: [address] [place], [place] Clinic Hours: Mon-Thu 10a-2p Services: Extractions only! Payment Options: FREE (donations accepted) - bring proof of income or support Best way to get seen: Call and schedule an appointment OR come at 8am on the 1st Monday of every month (except for holidays) when it is first come/first served.     [facility] [phone]   Location: [address] [place], [place] Clinic Hours: Friday mornings Services, Payment Options, Best way to get seen: Call for  info

## 2020-01-24 NOTE — ED Provider Notes (Signed)
[facility] Emergency Department Provider Note  ____________________________________________   First MD Initiated Contact with Patient 01/24/20 [phone]     (approximate)  I have reviewed the triage vital signs and the nursing notes.   HISTORY  Chief Complaint Dental Pain   HPI Marcus Hill is a 29 y.o. male presents to the ED with complaint of dental pain.  Patient states that he has had problems with his tooth for "a long time".  Patient also complains of gritting his teeth which makes his teeth hurt more.  In the past patient has taken antibiotics with improvement.  Patient also has a history of grinding his teeth while he is sleeping at night which causes him more dental pain.  He rates his pain as a 6 out of 10.      Past Medical History:  Diagnosis Date  . Cold   . Esophageal hernia   . GERD (gastroesophageal reflux disease)   . MRSA infection     Patient Active Problem List   Diagnosis Date Noted  . Hiatal hernia 01/07/2016  . Loss of weight   . Diarrhea   . Psychogenic vomiting with nausea   . Nausea and vomiting 12/04/2015  . Left upper quadrant pain 10/31/2015  . Heartburn 10/31/2015  . Pain, dental 10/31/2015  . Chronic diarrhea 10/17/2015  . Abnormal weight loss 10/17/2015  . Tobacco abuse 10/17/2015    Past Surgical History:  Procedure Laterality Date  . COLONOSCOPY WITH PROPOFOL N/A 12/09/2015   Procedure: COLONOSCOPY WITH PROPOFOL;  Surgeon: Lucilla Lame, MD;  Location: [place];  Service: Endoscopy;  Laterality: N/A;  . ESOPHAGOGASTRODUODENOSCOPY (EGD) WITH PROPOFOL N/A 12/09/2015   Procedure: ESOPHAGOGASTRODUODENOSCOPY (EGD) WITH PROPOFOL;  Surgeon: Lucilla Lame, MD;  Location: [place];  Service: Endoscopy;  Laterality: N/A;    Prior to Admission medications   Medication Sig Start Date End Date Taking? Authorizing Provider  amoxicillin (AMOXIL) 875 MG tablet Take 1 tablet (875 mg total) by mouth 2 (two)  times daily. 01/24/20   Johnn Hai, PA-C  ibuprofen (ADVIL) 600 MG tablet Take 1 tablet (600 mg total) by mouth every 8 (eight) hours as needed. 01/24/20   Johnn Hai, PA-C    Allergies Patient has no known allergies.  Family History  Problem Relation Age of Onset  . Hypertension Mother   . Hyperlipidemia Mother   . Diabetes Father   . Heart disease Father   . Heart attack Father   . Diabetes Maternal Grandmother   . Diabetes Maternal Grandfather   . Diabetes Paternal Grandmother   . Heart attack Paternal Grandmother   . Diabetes Paternal Grandfather   . Heart attack Paternal Grandfather   . Cancer Neg Hx   . COPD Neg Hx   . Stroke Neg Hx     Social History Social History   Tobacco Use  . Smoking status: Current Every Day Smoker    Packs/day: 1.00    Years: 10.00    Pack years: 10.00    Types: Cigarettes  . Smokeless tobacco: Never Used  Substance Use Topics  . Alcohol use: No  . Drug use: Yes    Types: Marijuana    Comment: 12 yrs    Review of Systems Constitutional: No fever/chills Eyes: No visual changes. ENT: No sore throat.  Positive dental pain. Cardiovascular: Denies chest pain. Respiratory: Denies shortness of breath. Gastrointestinal:   No nausea, no vomiting.  Skin: Negative for rash. Neurological: Negative for  focal weakness or numbness. ____________________________________________   PHYSICAL EXAM:  VITAL SIGNS: ED Triage Vitals [01/24/20 0714]  Enc Vitals Group     BP      Pulse      Resp      Temp      Temp src      SpO2      Weight 170 lb (77.1 kg)     Height 5\' 8"  (1.727 m)     Head Circumference      Peak Flow      Pain Score 6     Pain Loc      Pain Edu?      Excl. in GC?     Constitutional: Alert and oriented. Well appearing and in no acute distress. Eyes: Conjunctivae are normal. PERRL. EOMI. Head: Atraumatic. Nose: No congestion/rhinnorhea. Mouth/Throat: Mucous membranes are moist.  Oropharynx  non-erythematous.  Right upper molar with large [place] present.  No active drainage at present.  Gums are tender around the tooth. Neck: No stridor.   Hematological/Lymphatic/Immunilogical: No cervical lymphadenopathy. Cardiovascular: Normal rate, regular rhythm. Grossly normal heart sounds.  Good peripheral circulation. Respiratory: Normal respiratory effort.  No retractions. Lungs CTAB. Musculoskeletal: Moves upper and lower extremities with any difficulty.  Normal gait was noted. Neurologic:  Normal speech and language. No gross focal neurologic deficits are appreciated. No gait instability. Skin:  Skin is warm, dry and intact. Psychiatric: Mood and affect are normal. Speech and behavior are normal.  ____________________________________________   LABS (all labs ordered are listed, but only abnormal results are displayed)  Labs Reviewed - No data to display  PROCEDURES  Procedure(s) performed (including Critical Care):  Procedures   ____________________________________________   INITIAL IMPRESSION / ASSESSMENT AND PLAN / ED COURSE  As part of my medical decision making, I reviewed the following data within the electronic MEDICAL RECORD NUMBER Notes from prior ED visits and [place] Controlled Substance Database   29 year old male presents to the ED with complaint of dental pain.  Patient states that it comes and goes and has been infected several times.  Patient does not have a dentist.  He states he has been on antibiotics several times which causes the area to improve.  We discussed starting on antibiotics again today but he was given a list of dental clinics that charge on a sliding scale for him to have the tooth repaired or extracted. ____________________________________________   FINAL CLINICAL IMPRESSION(S) / ED DIAGNOSES  Final diagnoses:  Pain, dental     ED Discharge Orders         Ordered    amoxicillin (AMOXIL) 875 MG tablet  2 times daily     01/24/20 0803    ibuprofen  (ADVIL) 600 MG tablet  Every 8 hours PRN     01/24/20 0803           Note:  This document was prepared using Dragon voice recognition software and may include unintentional dictation errors.    01/26/20, PA-C 01/24/20 0815    01/26/20, MD 01/24/20 1014

## 2020-02-22 ENCOUNTER — Other Ambulatory Visit: Payer: Self-pay

## 2020-02-22 ENCOUNTER — Encounter: Payer: Self-pay | Admitting: Emergency Medicine

## 2020-02-22 ENCOUNTER — Emergency Department
Admission: EM | Admit: 2020-02-22 | Discharge: 2020-02-22 | Disposition: A | Discharge status: Home / Self Care | Payer: Self-pay | Attending: Student | Admitting: Student

## 2020-02-22 DIAGNOSIS — F17210 Nicotine dependence, cigarettes, uncomplicated: Secondary | ICD-10-CM | POA: Insufficient documentation

## 2020-02-22 DIAGNOSIS — K029 Dental caries, unspecified: Secondary | ICD-10-CM | POA: Insufficient documentation

## 2020-02-22 MED ORDER — IBUPROFEN 600 MG PO TABS: 600.0000 mg | ORAL_TABLET | Freq: Three times a day (TID) | ORAL | 0 refills | Status: DC | PRN

## 2020-02-22 MED ORDER — CLINDAMYCIN HCL 150 MG PO CAPS: 300.0000 mg | ORAL_CAPSULE | Freq: Three times a day (TID) | ORAL | 0 refills | Status: AC

## 2020-02-22 NOTE — Discharge Instructions (Signed)
Call the dentist that you have seen for tooth extraction and make an appointment as soon as possible.  Begin taking the antibiotic as directed until you are completely finished.  Also ibuprofen 600 mg 3 times daily was sent to your pharmacy to help with pain.

## 2020-02-22 NOTE — ED Triage Notes (Signed)
Dental pain "on and off for years." NAD.

## 2020-02-22 NOTE — ED Provider Notes (Signed)
[facility] Emergency Department Provider Note  ____________________________________________   First MD Initiated Contact with Patient 02/22/20 0848     (approximate)  I have reviewed the triage vital signs and the nursing notes.   HISTORY  Chief Complaint Dental Pain   HPI Marcus Hill is a 29 y.o. male presents to the ED with complaint of dental pain.  Patient has had multiple visits for the same complaint.  He states that he did have 2 teeth pulled on the left side and is saving money for the right side.  He was last seen in the ED on 01/24/2020.  He states that the amoxicillin is not working as well.  Patient continues to smoke cigarettes.       Past Medical History:  Diagnosis Date  . Cold   . Esophageal hernia   . GERD (gastroesophageal reflux disease)   . MRSA infection     Patient Active Problem List   Diagnosis Date Noted  . Hiatal hernia 01/07/2016  . Loss of weight   . Diarrhea   . Psychogenic vomiting with nausea   . Nausea and vomiting 12/04/2015  . Left upper quadrant pain 10/31/2015  . Heartburn 10/31/2015  . Pain, dental 10/31/2015  . Chronic diarrhea 10/17/2015  . Abnormal weight loss 10/17/2015  . Tobacco abuse 10/17/2015    Past Surgical History:  Procedure Laterality Date  . COLONOSCOPY WITH PROPOFOL N/A 12/09/2015   Procedure: COLONOSCOPY WITH PROPOFOL;  Surgeon: Midge Minium, MD;  Location: [facility];  Service: Endoscopy;  Laterality: N/A;  . ESOPHAGOGASTRODUODENOSCOPY (EGD) WITH PROPOFOL N/A 12/09/2015   Procedure: ESOPHAGOGASTRODUODENOSCOPY (EGD) WITH PROPOFOL;  Surgeon: Midge Minium, MD;  Location: [facility];  Service: Endoscopy;  Laterality: N/A;    Prior to Admission medications   Medication Sig Start Date End Date Taking? Authorizing Provider  clindamycin (CLEOCIN) 150 MG capsule Take 2 capsules (300 mg total) by mouth 3 (three) times daily for 7 days. 02/22/20 02/29/20  Tommi Rumps,  PA-C  ibuprofen (ADVIL) 600 MG tablet Take 1 tablet (600 mg total) by mouth every 8 (eight) hours as needed. 02/22/20   Tommi Rumps, PA-C    Allergies Patient has no known allergies.  Family History  Problem Relation Age of Onset  . Hypertension Mother   . Hyperlipidemia Mother   . Diabetes Father   . Heart disease Father   . Heart attack Father   . Diabetes Maternal Grandmother   . Diabetes Maternal Grandfather   . Diabetes Paternal Grandmother   . Heart attack Paternal Grandmother   . Diabetes Paternal Grandfather   . Heart attack Paternal Grandfather   . Cancer Neg Hx   . COPD Neg Hx   . Stroke Neg Hx     Social History Social History   Tobacco Use  . Smoking status: Current Every Day Smoker    Packs/day: 1.00    Years: 10.00    Pack years: 10.00    Types: Cigarettes  . Smokeless tobacco: Never Used  Substance Use Topics  . Alcohol use: No  . Drug use: Yes    Types: Marijuana    Comment: 12 yrs    Review of Systems Constitutional: No fever/chills Eyes: No visual changes. ENT: Positive for dental pain. Cardiovascular: Denies chest pain. Respiratory: Denies shortness of breath. Gastrointestinal:   No nausea, no vomiting.  Musculoskeletal: Negative for muscle aches. Skin: Negative for rash. Neurological: Negative for headaches  ____________________________________________   PHYSICAL EXAM:  VITAL SIGNS: ED Triage Vitals  Enc Vitals Group     BP      Pulse      Resp      Temp      Temp src      SpO2      Weight      Height      Head Circumference      Peak Flow      Pain Score      Pain Loc      Pain Edu?      Excl. in [place]?    Constitutional: Alert and oriented. Well appearing and in no acute distress. Eyes: Conjunctivae are normal.  Head: Atraumatic. Nose: No congestion/rhinnorhea. Mouth/Throat: Mucous membranes are moist.  Oropharynx non-erythematous.  Right upper molar with large caries present.  No localized abscess and no drainage  noted. Neck: No stridor.   Cardiovascular: Normal rate, regular rhythm. Grossly normal heart sounds.  Good peripheral circulation. Respiratory: Normal respiratory effort.  No retractions. Lungs CTAB. Musculoskeletal: Moves upper and lower extremities with any difficulty normal gait was noted. Neurologic:  Normal speech and language. No gross focal neurologic deficits are appreciated. No gait instability. Skin:  Skin is warm, dry and intact. No rash noted. Psychiatric: Mood and affect are normal. Speech and behavior are normal.  ____________________________________________   LABS (all labs ordered are listed, but only abnormal results are displayed)  Labs Reviewed - No data to display  PROCEDURES  Procedure(s) performed (including Critical Care):  Procedures  ____________________________________________   INITIAL IMPRESSION / ASSESSMENT AND PLAN / ED COURSE  As part of my medical decision making, I reviewed the following data within the electronic MEDICAL RECORD NUMBER Notes from prior ED visits and [place] Controlled Substance Database  Marcus Hill was evaluated in Emergency Department on 02/22/2020 for the symptoms described in the history of present illness. He was evaluated in the context of the global COVID-19 pandemic, which necessitated consideration that the patient might be at risk for infection with the SARS-CoV-2 virus that causes COVID-19. Institutional protocols and algorithms that pertain to the evaluation of patients at risk for COVID-19 are in a state of rapid change based on information released by regulatory bodies including the CDC and federal and state organizations. These policies and algorithms were followed during the patient's care in the ED. ____________________________________________   FINAL CLINICAL IMPRESSION(S) / ED DIAGNOSES  Final diagnoses:  Pain due to dental caries     ED Discharge Orders         Ordered    clindamycin (CLEOCIN) 150 MG capsule  3  times daily     02/22/20 0934    ibuprofen (ADVIL) 600 MG tablet  Every 8 hours PRN     02/22/20 0934           Note:  This document was prepared using Dragon voice recognition software and may include unintentional dictation errors.    Johnn Hai, PA-C 02/22/20 5035    Lilia Pro., MD 02/22/20 1046

## 2020-05-09 ENCOUNTER — Emergency Department
Admission: EM | Admit: 2020-05-09 | Discharge: 2020-05-09 | Disposition: A | Discharge status: Home / Self Care | Payer: Self-pay | Attending: Emergency Medicine | Admitting: Emergency Medicine

## 2020-05-09 ENCOUNTER — Other Ambulatory Visit: Payer: Self-pay

## 2020-05-09 ENCOUNTER — Emergency Department: Payer: Self-pay

## 2020-05-09 ENCOUNTER — Encounter: Payer: Self-pay | Admitting: *Deleted

## 2020-05-09 DIAGNOSIS — R0981 Nasal congestion: Secondary | ICD-10-CM | POA: Insufficient documentation

## 2020-05-09 DIAGNOSIS — J029 Acute pharyngitis, unspecified: Secondary | ICD-10-CM

## 2020-05-09 DIAGNOSIS — R509 Fever, unspecified: Secondary | ICD-10-CM | POA: Insufficient documentation

## 2020-05-09 DIAGNOSIS — R0602 Shortness of breath: Secondary | ICD-10-CM

## 2020-05-09 DIAGNOSIS — F17210 Nicotine dependence, cigarettes, uncomplicated: Secondary | ICD-10-CM | POA: Insufficient documentation

## 2020-05-09 DIAGNOSIS — Z20822 Contact with and (suspected) exposure to covid-19: Secondary | ICD-10-CM | POA: Insufficient documentation

## 2020-05-09 DIAGNOSIS — R519 Headache, unspecified: Secondary | ICD-10-CM | POA: Insufficient documentation

## 2020-05-09 DIAGNOSIS — R059 Cough, unspecified: Secondary | ICD-10-CM

## 2020-05-09 DIAGNOSIS — R05 Cough: Secondary | ICD-10-CM | POA: Insufficient documentation

## 2020-05-09 LAB — GROUP A STREP BY PCR: Group A Strep by PCR: NOT DETECTED

## 2020-05-09 MED ORDER — IBUPROFEN 800 MG PO TABS
800.0000 mg | ORAL_TABLET | Freq: Once | ORAL | Status: AC
  Administered 2020-05-09: 800 mg via ORAL
  Filled 2020-05-09: qty 1

## 2020-05-09 MED ORDER — AMOXICILLIN 500 MG PO CAPS
500.0000 mg | ORAL_CAPSULE | Freq: Three times a day (TID) | ORAL | 0 refills | Status: DC
Start: 2020-05-09 — End: 2023-11-22

## 2020-05-09 MED ORDER — AMOXICILLIN 500 MG PO CAPS
500.0000 mg | ORAL_CAPSULE | Freq: Once | ORAL | Status: AC
  Administered 2020-05-09: 500 mg via ORAL
  Filled 2020-05-09: qty 1

## 2020-05-09 NOTE — ED Provider Notes (Signed)
[facility] Emergency Department Provider Note ____________________________________________  Time seen: 2240  I have reviewed the triage vital signs and the nursing notes.  HISTORY  Chief Complaint  Sore Throat   HPI Marcus Hill is a 29 y.o. male presents to the ER today with complaint of headache, nasal congestion, sore throat, cough and shortness of breath.  He reports this started 2 to 3 days ago.  The headache is located in his temples.  He describes the pain as squeezing.  He denies associated dizziness, visual changes, sensitivity to light or sound, nausea or vomiting.  He is blowing yellow mucus out of his nose.  He reports difficulty swallowing.  The cough is mostly nonproductive.  He denies ear pain.  He reports fever up to 100 but denies chills or body aches.  He reports sick contacts diagnosed with strep.  He has not had exposure to Covid that he is aware of.  He has not had a Covid vaccine.  Past Medical History:  Diagnosis Date  . Cold   . Esophageal hernia   . GERD (gastroesophageal reflux disease)   . MRSA infection     Patient Active Problem List   Diagnosis Date Noted  . Hiatal hernia 01/07/2016  . Loss of weight   . Diarrhea   . Psychogenic vomiting with nausea   . Nausea and vomiting 12/04/2015  . Left upper quadrant pain 10/31/2015  . Heartburn 10/31/2015  . Pain, dental 10/31/2015  . Chronic diarrhea 10/17/2015  . Abnormal weight loss 10/17/2015  . Tobacco abuse 10/17/2015    Past Surgical History:  Procedure Laterality Date  . COLONOSCOPY WITH PROPOFOL N/A 12/09/2015   Procedure: COLONOSCOPY WITH PROPOFOL;  Surgeon: Midge Minium, MD;  Location: [facility];  Service: Endoscopy;  Laterality: N/A;  . ESOPHAGOGASTRODUODENOSCOPY (EGD) WITH PROPOFOL N/A 12/09/2015   Procedure: ESOPHAGOGASTRODUODENOSCOPY (EGD) WITH PROPOFOL;  Surgeon: Midge Minium, MD;  Location: [facility];  Service: Endoscopy;  Laterality: N/A;     Prior to Admission medications   Medication Sig Start Date End Date Taking? Authorizing Provider  amoxicillin (AMOXIL) 500 MG capsule Take 1 capsule (500 mg total) by mouth 3 (three) times daily. 05/09/20   Lorre Munroe, NP  ibuprofen (ADVIL) 600 MG tablet Take 1 tablet (600 mg total) by mouth every 8 (eight) hours as needed. 02/22/20   Tommi Rumps, PA-C    Allergies Patient has no known allergies.  Family History  Problem Relation Age of Onset  . Hypertension Mother   . Hyperlipidemia Mother   . Diabetes Father   . Heart disease Father   . Heart attack Father   . Diabetes Maternal Grandmother   . Diabetes Maternal Grandfather   . Diabetes Paternal Grandmother   . Heart attack Paternal Grandmother   . Diabetes Paternal Grandfather   . Heart attack Paternal Grandfather   . Cancer Neg Hx   . COPD Neg Hx   . Stroke Neg Hx     Social History Social History   Tobacco Use  . Smoking status: Current Every Day Smoker    Packs/day: 1.00    Years: 10.00    Pack years: 10.00    Types: Cigarettes  . Smokeless tobacco: Never Used  Substance Use Topics  . Alcohol use: No  . Drug use: Yes    Types: Marijuana    Comment: 12 yrs    Review of Systems  Constitutional: Positive for fever.  Negative for chills or body  aches Eyes: Negative for eye pain, eye redness, eye discharge or visual changes. ENT: Positive for nasal congestion and sore throat.  Negative for runny nose, ear pain. Cardiovascular: Negative for chest pain or chest tightness. Respiratory: Positive for cough and shortness of breath.  Gastrointestinal: Negative for nausea, vomiting and diarrhea. Skin: Negative for rash. Neurological: Positive for headache. Negative for focal weakness, tingling  or numbness. ____________________________________________  PHYSICAL EXAM:  VITAL SIGNS: ED Triage Vitals  Enc Vitals Group     BP 05/09/20 2215 (!) 150/85     Pulse Rate 05/09/20 2215 83     Resp 05/09/20  2215 18     Temp 05/09/20 2215 100 F (37.8 C)     Temp Source 05/09/20 2215 Oral     SpO2 05/09/20 2215 95 %     Weight 05/09/20 2217 170 lb (77.1 kg)     Height 05/09/20 2217 5\' 9"  (1.753 m)     Head Circumference --      Peak Flow --      Pain Score 05/09/20 2217 0     Pain Loc --      Pain Edu? --      Excl. in [place]? --     Constitutional: Alert and oriented.  Ill-appearing but in no distress. Head: Normocephalic, no sinus tenderness noted. Eyes: Conjunctivae are normal. PERRL. Normal extraocular movements Ears: Canals clear. TMs intact bilaterally. Nose: No congestion/rhinorrhea/epistaxis. Mouth/Throat: Mucous membranes are moist.  Posterior pharynx with erythema and exudate on bilateral tonsils. Hematological/Lymphatic/Immunological: Bilateral anterior cervical lymphadenopathy noted. Cardiovascular: Normal rate, regular rhythm. Respiratory: Normal respiratory effort. No wheezes/rales/rhonchi. Neurologic:  Normal gait without ataxia. Normal speech and language. No gross focal neurologic deficits are appreciated. Skin:  Skin is warm, dry and intact. No rash noted.  ____________________________________________   LABS Labs Reviewed  GROUP A STREP BY PCR  SARS CORONAVIRUS 2 (TAT 6-24 HRS)    ____________________________________________   RADIOLOGY   Imaging Orders     DG Chest 2 View  IMPRESSION: No acute chest findings. Minimal blunting of the left costophrenic angle may be related scarring.   ____________________________________________    INITIAL IMPRESSION / ASSESSMENT AND PLAN / ED COURSE  Acute Headache, Nasal Congestion, Sore Throat, Cough, SOB, Fever:  DDx include allergic rhinitis, viral sinusitis, viral URI cough, viral pharyngitis, bacterial pharyngitis, Covid. Rapid strep negative (will treat with abx given presentation and exposure) Chest xray negative Covid swab pending Ibuprofen 800 mg PO x  1 Amoxil 500 mg PO x 1 RX for Amoxil 500 mg TID x 10  days _____________________________________________________  FINAL CLINICAL IMPRESSION(S) / ED DIAGNOSES  Final diagnoses:  Acute intractable headache, unspecified headache type  Nasal congestion  Sore throat  Cough  SOB (shortness of breath)  Fever, unspecified fever cause      2218, NP 05/09/20 2335    2336, MD 05/14/20 [phone]

## 2020-05-09 NOTE — ED Triage Notes (Signed)
Pt reports sore throat for 2 days.  Pt states his children dx with strep throat.  Pt alert  Speech clear.

## 2020-05-09 NOTE — Discharge Instructions (Addendum)
You were seen today for headache, nasal congestion, sore throat, cough and shortness of breath.  Your chest x-ray is negative.  Your rapid strep test is also negative but this could be a false positive.  I am treating you with antibiotics 3 times daily for the next 10 days.  You may take ibuprofen 600 mg every 8 hours as needed with food for pain and inflammation.  Your Covid test is pending, they will call you if this is positive.  Follow-up if symptoms persist or worsen.

## 2020-05-10 LAB — SARS CORONAVIRUS 2 (TAT 6-24 HRS): SARS Coronavirus 2: NEGATIVE

## 2020-11-05 ENCOUNTER — Emergency Department
Admission: EM | Admit: 2020-11-05 | Discharge: 2020-11-05 | Disposition: A | Discharge status: Home / Self Care | Payer: Self-pay | Attending: Emergency Medicine | Admitting: Emergency Medicine

## 2020-11-05 ENCOUNTER — Encounter: Payer: Self-pay | Admitting: Emergency Medicine

## 2020-11-05 DIAGNOSIS — K029 Dental caries, unspecified: Secondary | ICD-10-CM | POA: Insufficient documentation

## 2020-11-05 DIAGNOSIS — F17210 Nicotine dependence, cigarettes, uncomplicated: Secondary | ICD-10-CM | POA: Insufficient documentation

## 2020-11-05 MED ORDER — CLINDAMYCIN HCL 150 MG PO CAPS
150.0000 mg | ORAL_CAPSULE | Freq: Four times a day (QID) | ORAL | 0 refills | Status: AC
Start: 2020-11-05 — End: 2020-11-15

## 2020-11-05 MED ORDER — IBUPROFEN 200 MG PO TABS
600.0000 mg | ORAL_TABLET | Freq: Four times a day (QID) | ORAL | 0 refills | Status: AC | PRN
Start: 2020-11-05 — End: 2020-12-05

## 2020-11-05 NOTE — ED Provider Notes (Signed)
[facility] Emergency Department Provider Note  ____________________________________________   Event Date/Time   First MD Initiated Contact with Patient 11/05/20 1518     (approximate)  I have reviewed the triage vital signs and the nursing notes.   HISTORY  Chief Complaint Dental Pain   HPI Marcus Hill is a 30 y.o. male who presents to the emergency department for evaluation of dental pain. He states he has had pain in the right upper molar region off and on for "years" but that it has acutely been bothering him more this week. He has a history of similar that required antibiotics for treatment, which he is seeking today. He states after last episode, he had two teeth pulled on upper left, but that he was waiting for his dental insurance to kick in to take care of the right upper molars. He denies systemic symptoms such as fever, chills and denies any facial or tongue swelling.       Past Medical History:  Diagnosis Date  . Cold   . Esophageal hernia   . GERD (gastroesophageal reflux disease)   . MRSA infection     Patient Active Problem List   Diagnosis Date Noted  . Hiatal hernia 01/07/2016  . Loss of weight   . Diarrhea   . Psychogenic vomiting with nausea   . Nausea and vomiting 12/04/2015  . Left upper quadrant pain 10/31/2015  . Heartburn 10/31/2015  . Pain, dental 10/31/2015  . Chronic diarrhea 10/17/2015  . Abnormal weight loss 10/17/2015  . Tobacco abuse 10/17/2015    Past Surgical History:  Procedure Laterality Date  . COLONOSCOPY WITH PROPOFOL N/A 12/09/2015   Procedure: COLONOSCOPY WITH PROPOFOL;  Surgeon: Midge Minium, MD;  Location: [facility];  Service: Endoscopy;  Laterality: N/A;  . ESOPHAGOGASTRODUODENOSCOPY (EGD) WITH PROPOFOL N/A 12/09/2015   Procedure: ESOPHAGOGASTRODUODENOSCOPY (EGD) WITH PROPOFOL;  Surgeon: Midge Minium, MD;  Location: [facility];  Service: Endoscopy;  Laterality: N/A;    Prior to  Admission medications   Medication Sig Start Date End Date Taking? Authorizing Provider  clindamycin (CLEOCIN) 150 MG capsule Take 1 capsule (150 mg total) by mouth 4 (four) times daily for 10 days. 11/05/20 11/15/20 Yes Kristl Morioka, Ruben Gottron, PA  ibuprofen (MOTRIN IB) 200 MG tablet Take 3 tablets (600 mg total) by mouth every 6 (six) hours as needed. 11/05/20 12/05/20 Yes Annessa Satre, Ruben Gottron, PA  amoxicillin (AMOXIL) 500 MG capsule Take 1 capsule (500 mg total) by mouth 3 (three) times daily. 05/09/20   Lorre Munroe, NP    Allergies Patient has no known allergies.  Family History  Problem Relation Age of Onset  . Hypertension Mother   . Hyperlipidemia Mother   . Diabetes Father   . Heart disease Father   . Heart attack Father   . Diabetes Maternal Grandmother   . Diabetes Maternal Grandfather   . Diabetes Paternal Grandmother   . Heart attack Paternal Grandmother   . Diabetes Paternal Grandfather   . Heart attack Paternal Grandfather   . Cancer Neg Hx   . COPD Neg Hx   . Stroke Neg Hx     Social History Social History   Tobacco Use  . Smoking status: Current Every Day Smoker    Packs/day: 1.00    Years: 10.00    Pack years: 10.00    Types: Cigarettes  . Smokeless tobacco: Never Used  Substance Use Topics  . Alcohol use: No  . Drug use: Yes  Types: Marijuana    Comment: 12 yrs    Review of Systems Constitutional: No fever/chills Eyes: No visual changes. ENT: +dental pain, No sore throat. Cardiovascular: Denies chest pain. Respiratory: Denies shortness of breath. Gastrointestinal: No abdominal pain.  No nausea, no vomiting.  No diarrhea.  No constipation. Genitourinary: Negative for dysuria. Musculoskeletal: Negative for back pain. Skin: Negative for rash. Neurological: Negative for headaches, focal weakness or numbness.  ____________________________________________   PHYSICAL EXAM:  VITAL SIGNS: ED Triage Vitals  Enc Vitals Group     BP 11/05/20 1343 (!)  136/91     Pulse Rate 11/05/20 1343 79     Resp 11/05/20 1343 16     Temp 11/05/20 1343 97.9 F (36.6 C)     Temp Source 11/05/20 1343 Oral     SpO2 11/05/20 1343 100 %     Weight 11/05/20 1349 169 lb 15.6 oz (77.1 kg)     Height --      Head Circumference --      Peak Flow --      Pain Score --      Pain Loc --      Pain Edu? --      Excl. in [place]? --    Constitutional: Alert and oriented. Well appearing and in no acute distress. Eyes: Conjunctivae are normal. PERRL. EOMI. Head: Atraumatic. Nose: No congestion/rhinnorhea. Mouth/Throat: Mucous membranes are moist.  Oropharynx non-erythematous. There are multiple dental caries throughout the mouth. There is tenderness along the right upper molars, with no obvious dental abscess. No facial swelling present. Neck: No stridor.  Lymphatic: No cervical lymphadenopathy. Cardiovascular: Normal rate, regular rhythm. Grossly normal heart sounds.  Good peripheral circulation. Respiratory: Normal respiratory effort.  No retractions. Lungs CTAB. Neurologic:  Normal speech and language. No gross focal neurologic deficits are appreciated. No gait instability. Skin:  Skin is warm, dry and intact. No rash noted. Psychiatric: Mood and affect are normal. Speech and behavior are normal.  ____________________________________________   INITIAL IMPRESSION / ASSESSMENT AND PLAN / ED COURSE  As part of my medical decision making, I reviewed the following data within the [facility] notes        Patient is a 30 yo with history of issues with multiple dental caries who presents to the ER for evaluation of pain to the right upper molar region. See HPI for further info. On physical exam, patient is afebrile and has tenderness to the right upper molars without any abscess or facial swelling present. Patient states he has failed amoxicillin in the past, will prescribe clindamycin. Patient encouraged to have close follow up with dentist.  Patient will return for any acute worsening.      ____________________________________________   FINAL CLINICAL IMPRESSION(S) / ED DIAGNOSES  Final diagnoses:  Dental caries     ED Discharge Orders         Ordered    clindamycin (CLEOCIN) 150 MG capsule  4 times daily        11/05/20 1523    ibuprofen (MOTRIN IB) 200 MG tablet  Every 6 hours PRN        11/05/20 1523          *Please note:  Marcus Hill was evaluated in Emergency Department on 11/05/2020 for the symptoms described in the history of present illness. He was evaluated in the context of the global COVID-19 pandemic, which necessitated consideration that the patient might be at risk for infection with the SARS-CoV-2  virus that causes COVID-19. Institutional protocols and algorithms that pertain to the evaluation of patients at risk for COVID-19 are in a state of rapid change based on information released by regulatory bodies including the CDC and federal and state organizations. These policies and algorithms were followed during the patient's care in the ED.  Some ED evaluations and interventions may be delayed as a result of limited staffing during and the pandemic.*   Note:  This document was prepared using Dragon voice recognition software and may include unintentional dictation errors.    Lucy Chris, PA 11/05/20 1535    Arnaldo Natal, MD 11/05/20 [phone]

## 2020-11-05 NOTE — ED Triage Notes (Signed)
Pt in w/dental pain, R upper molar area. Denies any fevers, but tooth has been bothering him >1wk. Multiple dental caries present

## 2020-11-05 NOTE — Discharge Instructions (Signed)
OPTIONS FOR DENTAL FOLLOW UP CARE ° °[place] Department of Health and Human Services - Local Safety Net Dental Clinics °http://www.ncdhhs.gov/dph/oralhealth/services/safetynetclinics.htm °  °[facility] (336-562-3123) ° °[facility] (919-933-9087) ° °[facility] (919-663-1744 ext [phone]) ° °[facility] ([phone]) ° °[facility] ([phone]) °This clinic caters to the indigent population and is on a lottery system. °Location: °[facility], [address], [place] °Clinic Hours: °Wednesdays from 6pm - 9pm, patients seen by a lottery system. °For dates, call or go to www.med.[facility].edu/[facility]/patients/Dental-[facility] °Services: °Cleanings, fillings and simple extractions. °Payment Options: °DENTAL WORK IS FREE OF CHARGE. Bring proof of income or support. °Best way to get seen: °Arrive at 5:15 pm - this is a lottery, NOT first come/first serve, so arriving earlier will not increase your chances of being seen. °  °  °[facility] °[phone] °Select option 1 for emergencies °  °Location: °[facility], [address], [place] °Clinic Hours: °No walk-ins accepted - call the day before to schedule an appointment. °Check in times are 9:30 am and 1:30 pm. °Services: °Simple extractions, temporary fillings, pulpectomy/pulp debridement, uncomplicated abscess drainage. °Payment Options: °PAYMENT IS DUE AT THE TIME OF SERVICE.  Fee is usually $100-200, additional surgical procedures (e.g. abscess drainage) may be extra. °Cash, checks, Visa/MasterCard accepted.  Can file Medicaid if patient is covered for dental - patient should call case worker to check. °No discount for [facility] patients. °Best way to get seen: °MUST call the day before and get onto the schedule. Can usually be seen the next 1-2 days. No walk-ins accepted. °  °  °[facility] °[phone] °   °Location: °[facility], [address], [place] °Clinic Hours: °M, W, Th, F 8am or 1:30pm, Tues 9a or 1:30 - first come/first served. °Services: °Simple extractions, temporary fillings, uncomplicated abscess drainage.  You do not need to be an [place] resident. °Payment Options: °PAYMENT IS DUE AT THE TIME OF SERVICE. °Dental insurance, otherwise sliding scale - bring proof of income or support. °Depending on income and treatment needed, cost is usually $50-200. °Best way to get seen: °Arrive early as it is first come/first served. °  °  °[facility] °[phone] °  °Location: °[address] °Clinic Hours: °Mon-Thu 8a-5p °Services: °Most basic dental services including extractions and fillings. °Payment Options: °PAYMENT IS DUE AT THE TIME OF SERVICE. °Sliding scale, up to 50% off - bring proof if income or support. °Medicaid with dental option accepted. °Best way to get seen: °Call to schedule an appointment, can usually be seen within 2 weeks OR they will try to see walk-ins - show up at 8a or 2p (you may have to wait). °  °  °[facility] °[phone] °[place] RESIDENTS ONLY °  °Location: °[facility], [address], [place], [place] [address] °Clinic Hours: By appointment only. °Monday - Thursday 8am-5pm, Friday 8am-12pm °Services: Cleanings, fillings, extractions. °Payment Options: °PAYMENT IS DUE AT THE TIME OF SERVICE. °Cash, Visa or MasterCard. Sliding scale - $30 minimum per service. °Best way to get seen: °Come in to office, complete packet and make an appointment - need proof of income °or support monies for each household member and proof of [place] residence. °Usually takes about a month to get in. °  °  °[facility] °[phone] °  °Location: °[address]   [place] °Clinic Hours: Walk-in Urgent Care Dental Services are offered Monday-Friday  mornings only. °The numbers of emergencies accepted daily is limited to the number of °providers available. °Maximum 15 - Mondays, Wednesdays & Thursdays °Maximum 10 - Tuesdays & Fridays °Services: °You do not need to be a [place] resident to be seen for a dental emergency. °Emergencies are defined as pain, swelling, abnormal bleeding, or dental trauma. Walkins will receive x-rays if needed. °NOTE: Dental cleaning is not an emergency. °Payment Options: °PAYMENT IS DUE AT THE TIME OF SERVICE. °Minimum co-pay is $40.00 for uninsured patients. °Minimum co-pay is $3.00 for Medicaid with dental coverage. °Dental Insurance is accepted and must be presented at time of visit. °Medicare does not cover dental. °Forms of payment: Cash, credit card, checks. °Best way to get seen: °If not previously registered with the clinic, walk-in dental registration begins at 7:15 am and is on a first come/first serve basis. °If previously registered with the clinic, call to make an appointment. °  °  °[facility] °[phone] °[place] RESIDENTS ONLY °  °Location: °[address], [place], [place] °Clinic Hours: °Mon-Thu 10a-2p °Services: Extractions only! °Payment Options: °FREE (donations accepted) - bring proof of income or support °Best way to get seen: °Call and schedule an appointment OR come at 8am on the 1st Monday of every month (except for holidays) when it is first come/first served. °  °  °[facility] °[phone] °  °Location: °[address], [place] °Clinic Hours: °Friday mornings °Services, Payment Options, Best way to get seen: °Call for info °

## 2021-04-16 ENCOUNTER — Other Ambulatory Visit: Payer: Self-pay

## 2021-04-16 ENCOUNTER — Emergency Department
Admission: EM | Admit: 2021-04-16 | Discharge: 2021-04-16 | Disposition: A | Discharge status: Home / Self Care | Payer: BC Managed Care – PPO | Attending: Emergency Medicine | Admitting: Emergency Medicine

## 2021-04-16 ENCOUNTER — Encounter: Payer: Self-pay | Admitting: Emergency Medicine

## 2021-04-16 DIAGNOSIS — F17210 Nicotine dependence, cigarettes, uncomplicated: Secondary | ICD-10-CM | POA: Diagnosis not present

## 2021-04-16 DIAGNOSIS — R42 Dizziness and giddiness: Secondary | ICD-10-CM | POA: Insufficient documentation

## 2021-04-16 DIAGNOSIS — E860 Dehydration: Secondary | ICD-10-CM | POA: Insufficient documentation

## 2021-04-16 LAB — CBC
HCT: 43.3 % (ref 39.0–52.0)
Hemoglobin: 14.8 g/dL (ref 13.0–17.0)
MCH: 32.2 pg (ref 26.0–34.0)
MCHC: 34.2 g/dL (ref 30.0–36.0)
MCV: 94.3 fL (ref 80.0–100.0)
Platelets: 192 10*3/uL (ref 150–400)
RBC: 4.59 MIL/uL (ref 4.22–5.81)
RDW: 12.9 % (ref 11.5–15.5)
WBC: 8.1 10*3/uL (ref 4.0–10.5)
nRBC: 0 % (ref 0.0–0.2)

## 2021-04-16 LAB — BASIC METABOLIC PANEL
Anion gap: 9 (ref 5–15)
BUN: 11 mg/dL (ref 6–20)
CO2: 27 mmol/L (ref 22–32)
Calcium: 9.5 mg/dL (ref 8.9–10.3)
Chloride: 104 mmol/L (ref 98–111)
Creatinine, Ser: 0.93 mg/dL (ref 0.61–1.24)
GFR, Estimated: >60 mL/min (ref >60)
Glucose, Bld: 101 mg/dL — ABNORMAL HIGH (ref 70–99)
Potassium: 4.3 mmol/L (ref 3.5–5.1)
Sodium: 140 mmol/L (ref 135–145)

## 2021-04-16 LAB — URINALYSIS, COMPLETE (UACMP) WITH MICROSCOPIC
Bacteria, UA: NONE SEEN
Bilirubin Urine: NEGATIVE
Glucose, UA: NEGATIVE mg/dL
Hgb urine dipstick: NEGATIVE
Ketones, ur: NEGATIVE mg/dL
Leukocytes,Ua: NEGATIVE
Nitrite: NEGATIVE
Protein, ur: NEGATIVE mg/dL
Specific Gravity, Urine: 1.008 (ref 1.005–1.030)
Squamous Epithelial / HPF: NONE SEEN (ref 0–5)
pH: 6 (ref 5.0–8.0)

## 2021-04-16 MED ORDER — LACTATED RINGERS IV BOLUS
1000.0000 mL | Freq: Once | INTRAVENOUS | Status: AC
  Administered 2021-04-16: 1000 mL via INTRAVENOUS

## 2021-04-16 NOTE — ED Triage Notes (Signed)
Pt comes into the ED via POV c/o dizziness since yesterday. Pt states he did have N/V on Monday and he is concerned he is dehydrated.  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  P{T ambulatory to triage at this time and in NAD.  Pt's work is requiring a note for having to leave work.

## 2021-04-16 NOTE — ED Provider Notes (Signed)
[facility] Emergency Department Provider Note ____________________________________________   Event Date/Time   First MD Initiated Contact with Patient 04/16/21 1616     (approximate)  I have reviewed the triage vital signs and the nursing notes.  HISTORY  Chief Complaint Dizziness   HPI Marcus Hill is a 30 y.o. malewho presents to the ED for evaluation of dizziness.   Chart review indicates hx GERD.   Patient presents to the ED for evaluation at the direction of his workplace/employer.  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  He reports conditions greater than 100 F.  He reports concern for dehydration due to generalized weakness, frequent sweating and generalized malaise.  He reports an episode of nonbloody nonbilious emesis greater than 48 hours ago, subsequently tolerating p.o. intake, "drinking tons of water" without abdominal pain or recurrent emesis.  Reports concern for dehydration explicitly.   Further reports presyncopal lightheaded dizziness at his work today, which was the precipitating factor to come in today.  Denies any syncopal episodes, falls or trauma.  Past Medical History:  Diagnosis Date   Cold    Esophageal hernia    GERD (gastroesophageal reflux disease)    MRSA infection     Patient Active Problem List   Diagnosis Date Noted   Hiatal hernia 01/07/2016   Loss of weight    Diarrhea    Psychogenic vomiting with nausea    Nausea and vomiting 12/04/2015   Left upper quadrant pain 10/31/2015   Heartburn 10/31/2015   Pain, dental 10/31/2015   Chronic diarrhea 10/17/2015   Abnormal weight loss 10/17/2015   Tobacco abuse 10/17/2015    Past Surgical History:  Procedure Laterality Date   COLONOSCOPY WITH PROPOFOL N/A 12/09/2015   Procedure: COLONOSCOPY WITH PROPOFOL;  Surgeon: Midge Minium, MD;  Location: [facility];  Service: Endoscopy;  Laterality: N/A;    ESOPHAGOGASTRODUODENOSCOPY (EGD) WITH PROPOFOL N/A 12/09/2015   Procedure: ESOPHAGOGASTRODUODENOSCOPY (EGD) WITH PROPOFOL;  Surgeon: Midge Minium, MD;  Location: [facility];  Service: Endoscopy;  Laterality: N/A;    Prior to Admission medications   Medication Sig Start Date End Date Taking? Authorizing Provider  amoxicillin (AMOXIL) 500 MG capsule Take 1 capsule (500 mg total) by mouth 3 (three) times daily. 05/09/20   Lorre Munroe, NP    Allergies Patient has no known allergies.  Family History  Problem Relation Age of Onset   Hypertension Mother    Hyperlipidemia Mother    Diabetes Father    Heart disease Father    Heart attack Father    Diabetes Maternal Grandmother    Diabetes Maternal Grandfather    Diabetes Paternal Grandmother    Heart attack Paternal Grandmother    Diabetes Paternal Grandfather    Heart attack Paternal Grandfather    Cancer Neg Hx    COPD Neg Hx    Stroke Neg Hx     Social History Social History   Tobacco Use   Smoking status: Every Day    Packs/day: 1.00    Years: 10.00    Pack years: 10.00    Types: Cigarettes   Smokeless tobacco: Never  Substance Use Topics   Alcohol use: No   Drug use: Yes    Types: Marijuana    Comment: 12 yrs    Review of Systems  Constitutional: No fever/chills Eyes: No visual changes. ENT: No sore throat. Cardiovascular: Denies chest pain. Respiratory: Denies shortness of breath. Gastrointestinal:  Positive for nausea and vomiting. No abdominal pain.  No diarrhea.  No constipation. Genitourinary: Negative for dysuria. Musculoskeletal: Negative for back pain. Skin: Negative for rash. Neurological: Negative for headaches, focal weakness or numbness.  ____________________________________________   PHYSICAL EXAM:  VITAL SIGNS: Vitals:   04/16/21 1431  BP: 123/72  Pulse: (!) 54  Resp: 17  Temp: 98.4 F (36.9 C)  SpO2: 97%      Constitutional: Alert and oriented. Well appearing and in no  acute distress. Eyes: Conjunctivae are normal. PERRL. EOMI. Head: Atraumatic. Nose: No congestion/rhinnorhea. Mouth/Throat: Mucous membranes are dry.  Oropharynx non-erythematous. Neck: No stridor. No cervical spine tenderness to palpation. Cardiovascular: Normal rate, regular rhythm. Grossly normal heart sounds.  Good peripheral circulation. Respiratory: Normal respiratory effort.  No retractions. Lungs CTAB. Gastrointestinal: Soft , nondistended, nontender to palpation. No CVA tenderness. Musculoskeletal: No lower extremity tenderness nor edema.  No joint effusions. No signs of acute trauma. Neurologic:  Normal speech and language. No gross focal neurologic deficits are appreciated. No gait instability noted. Skin:  Skin is warm, dry and intact. No rash noted. Psychiatric: Mood and affect are normal. Speech and behavior are normal.  ____________________________________________   LABS (all labs ordered are listed, but only abnormal results are displayed)  Labs Reviewed  BASIC METABOLIC PANEL - Abnormal; Notable for the following components:      Result Value   Glucose, Bld 101 (*)    All other components within normal limits  URINALYSIS, COMPLETE (UACMP) WITH MICROSCOPIC - Abnormal; Notable for the following components:   Color, Urine YELLOW (*)    APPearance CLEAR (*)    All other components within normal limits  CBC   ____________________________________________  12 Lead EKG  Sinus bradycardia, rate of 47 bpm.  Normal axis.  Incomplete right bundle and otherwise normal intervals.  No evidence of acute ischemia. ____________________________________________  RADIOLOGY  ED MD interpretation:    Official radiology report(s): No results found.  ____________________________________________   PROCEDURES and INTERVENTIONS  Procedure(s) performed (including Critical Care):  Procedures  Medications  lactated ringers bolus 1,000 mL (1,000 mLs Intravenous New Bag/Given  04/16/21 1636)    ____________________________________________   MDM / ED COURSE   30 year old male presents to the ED for evaluation of dizziness and concern for dehydration in the setting of heat exposure at his workplace, with some evidence of mild dehydration, and ultimately amenable to outpatient management.  Normal vitals.  Exam with dry mucous membranes suggestive of dehydration, otherwise unremarkable.  No evidence of neurologic or vascular deficits.  Blood work without AKI or significant derangements.  EKG is nonischemic with normal intervals.  Provided a liter of IV fluids with resolution of his symptoms.  We will discharge with return precautions.  Clinical Course as of 04/16/21 1934  Wed Apr 16, 2021  [id number] Patient reports feeling much better after IV fluids.  He is tolerating p.o. intake.  No barriers to outpatient management.  We will discharge with return precautions. [DS]    Clinical Course User Index [DS] Delton Prairie, MD    ____________________________________________   FINAL CLINICAL IMPRESSION(S) / ED DIAGNOSES  Final diagnoses:  Dizziness  Dehydration     ED Discharge Orders     None        Cana Mignano Katrinka Blazing   Note:  This document was prepared using Dragon voice recognition software and may include unintentional dictation errors.    Delton Prairie, MD 04/16/21 [phone]

## 2021-06-30 ENCOUNTER — Other Ambulatory Visit: Payer: Self-pay

## 2021-06-30 ENCOUNTER — Emergency Department
Admission: EM | Admit: 2021-06-30 | Discharge: 2021-06-30 | Disposition: A | Discharge status: Home / Self Care | Payer: BC Managed Care – PPO | Attending: Emergency Medicine | Admitting: Emergency Medicine

## 2021-06-30 DIAGNOSIS — Z5321 Procedure and treatment not carried out due to patient leaving prior to being seen by health care provider: Secondary | ICD-10-CM | POA: Diagnosis not present

## 2021-06-30 DIAGNOSIS — K0889 Other specified disorders of teeth and supporting structures: Secondary | ICD-10-CM | POA: Insufficient documentation

## 2021-06-30 NOTE — ED Triage Notes (Signed)
Pt complains of right upper tooth pain for years, states tonight pain is worse and is requesting antibiotics and pain medication.

## 2023-06-02 ENCOUNTER — Emergency Department
Admission: EM | Admit: 2023-06-02 | Discharge: 2023-06-02 | Disposition: A | Discharge status: Home / Self Care | Payer: BC Managed Care – PPO | Attending: Emergency Medicine | Admitting: Emergency Medicine

## 2023-06-02 ENCOUNTER — Other Ambulatory Visit: Payer: Self-pay

## 2023-06-02 DIAGNOSIS — R197 Diarrhea, unspecified: Secondary | ICD-10-CM | POA: Insufficient documentation

## 2023-06-02 NOTE — Discharge Instructions (Signed)
You did not wish to have any workup today, or any meds given.  Please return if you develop any new, worsening, or chang in symptoms or other concerns including but not limited to blood in your stool, abdominal pain, fevers, inability to keep any foods or liquids down, or any other concerns.  It was a pleasure caring for you today.

## 2023-06-02 NOTE — ED Provider Notes (Signed)
[facility] Provider Note    Event Date/Time   First MD Initiated Contact with Patient 06/02/23 1347     (approximate)   History   Letter for School/Work   HPI  Marcus Hill is a 32 y.o. male with a past medical history of chronic diarrhea who presents today with request for work note.  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  He would like to go back to work tomorrow.  He is requesting a work note for today only.  He denies abdominal pain, blood in his stool, nausea, vomiting, fevers.  He denies any recent trips or travel or recent antibiotic use.  He declines any workup today and request a work note only.  Patient Active Problem List   Diagnosis Date Noted   Hiatal hernia 01/07/2016   Loss of weight    Diarrhea    Psychogenic vomiting with nausea    Nausea and vomiting 12/04/2015   Left upper quadrant pain 10/31/2015   Heartburn 10/31/2015   Pain, dental 10/31/2015   Chronic diarrhea 10/17/2015   Abnormal weight loss 10/17/2015   Tobacco abuse 10/17/2015          Physical Exam   Triage Vital Signs: ED Triage Vitals  Encounter Vitals Group     BP 06/02/23 1332 (!) 138/100     Systolic BP Percentile --      Diastolic BP Percentile --      Pulse Rate 06/02/23 1332 70     Resp 06/02/23 1332 18     Temp 06/02/23 1332 98 F (36.7 C)     Temp src --      SpO2 06/02/23 1332 100 %     Weight 06/02/23 1333 180 lb (81.6 kg)     Height 06/02/23 1333 5\' 8"  (1.727 m)     Head Circumference --      Peak Flow --      Pain Score 06/02/23 1333 0     Pain Loc --      Pain Education --      Exclude from Growth Chart --     Most recent vital signs: Vitals:   06/02/23 1332  BP: (!) 138/100  Pulse: 70  Resp: 18  Temp: 98 F (36.7 C)  SpO2: 100%    Physical Exam Vitals and nursing note reviewed.   Constitutional:      General: Awake and alert. No acute distress.    Appearance: Normal appearance. The patient is overweight.  HENT:     Head: Normocephalic and atraumatic.     Mouth: Mucous membranes are moist.  Eyes:     General: PERRL. Normal EOMs        Right eye: No discharge.        Left eye: No discharge.     Conjunctiva/sclera: Conjunctivae normal.  Cardiovascular:     Rate and Rhythm: Normal rate and regular rhythm.     Pulses: Normal pulses.  Pulmonary:     Effort: Pulmonary effort is normal. No respiratory distress.     Breath sounds: Normal breath sounds.  Abdominal:     Abdomen is soft. There is no abdominal tenderness. No rebound or guarding. No distention. Musculoskeletal:        General: No swelling. Normal range of motion.     Cervical back: Normal range of  motion and neck supple.  Skin:    General: Skin is warm and dry.     Capillary Refill: Capillary refill takes less than 2 seconds.     Findings: No rash.  Neurological:     Mental Status: The patient is awake and alert.      ED Results / Procedures / Treatments   Labs (all labs ordered are listed, but only abnormal results are displayed) Labs Reviewed - No data to display   EKG     RADIOLOGY     PROCEDURES:  Critical Care performed:   Procedures   MEDICATIONS ORDERED IN ED: Medications - No data to display   IMPRESSION / MDM / ASSESSMENT AND PLAN / ED COURSE  I reviewed the triage vital signs and the nursing notes.   Differential diagnosis includes, but is not limited to, gastroenteritis, exacerbation of his chronic diarrhea, less likely electrolyte disarray or dehydration.  Patient is awake and alert, hemodynamically stable and afebrile.  He declined any further workup or imaging, requesting work note only.  Patient is overall well-appearing. No abdominal pain or tenderness. Vomiting and diarrhea are non-bloody. Patient is hemodynamically stable. No history of  immunosuppression, no other red flags such as recent travel, sick contacts or recent antibiotic use. Tolerating oral intake. Differential diagnosis is broad however without focal abdominal tenderness and given improvement, no concern that the patient requires urgent imaging or has surgical process in abdomen. Given that patient has a paucity of red flags for the vomiting and diarrhea as it pertains to patient's past medical history and history of present illness, no indication for further observation or empiric antibiotic treatment.  The discussed the importance of close outpatient follow-up, and return to the emergency department if his symptoms continue or worsen.  Discussed care plan, return precautions, and advised close outpatient follow-up. Patient agrees with plan of care.  He was given his work note as requested.   Patient's presentation is most consistent with acute complicated illness / injury requiring diagnostic workup.    FINAL CLINICAL IMPRESSION(S) / ED DIAGNOSES   Final diagnoses:  Diarrhea, unspecified type     Rx / DC Orders   ED Discharge Orders     None        Note:  This document was prepared using Dragon voice recognition software and may include unintentional dictation errors.   Jackelyn Hoehn, PA-C 06/02/23 1442    Corena Herter, MD 06/02/23 1616

## 2023-06-02 NOTE — ED Triage Notes (Signed)
Pt to ED for work note, states has been having diarrhea since eating [employer].

## 2023-11-22 ENCOUNTER — Other Ambulatory Visit: Payer: Self-pay

## 2023-11-22 ENCOUNTER — Emergency Department
Admission: EM | Admit: 2023-11-22 | Discharge: 2023-11-22 | Disposition: A | Discharge status: Home / Self Care | Payer: Self-pay | Attending: Emergency Medicine | Admitting: Emergency Medicine

## 2023-11-22 DIAGNOSIS — K047 Periapical abscess without sinus: Secondary | ICD-10-CM | POA: Insufficient documentation

## 2023-11-22 DIAGNOSIS — K0381 Cracked tooth: Secondary | ICD-10-CM | POA: Insufficient documentation

## 2023-11-22 DIAGNOSIS — K0889 Other specified disorders of teeth and supporting structures: Secondary | ICD-10-CM

## 2023-11-22 MED ORDER — AMOXICILLIN-POT CLAVULANATE 875-125 MG PO TABS: 1.0000 | ORAL_TABLET | Freq: Two times a day (BID) | ORAL | 0 refills | Status: AC

## 2023-11-22 MED ORDER — IBUPROFEN 400 MG PO TABS
400.0000 mg | ORAL_TABLET | Freq: Once | ORAL | Status: AC
  Administered 2023-11-22: 400 mg via ORAL
  Filled 2023-11-22: qty 1

## 2023-11-22 MED ORDER — AMOXICILLIN-POT CLAVULANATE 875-125 MG PO TABS
1.0000 | ORAL_TABLET | Freq: Once | ORAL | Status: AC
  Administered 2023-11-22: 1 via ORAL
  Filled 2023-11-22: qty 1

## 2023-11-22 NOTE — ED Provider Notes (Signed)
[facility] Provider Note    Event Date/Time   First MD Initiated Contact with Patient 11/22/23 0522     (approximate)   History   Chief Complaint Dental Pain   HPI  RYA ROEL is a 33 y.o. male with no significant past medical history who presents to the ED complaining of dental pain.  Patient reports that he has had about 5 days of increasing pain with mild swelling to the area around his right lower molar.  He states he knows he has a broken tooth there and is now concerned that he could have an infection.  He denies any fevers and has not had any difficulty swallowing.  He does not have a dentist that he sees regularly.     Physical Exam   Triage Vital Signs: ED Triage Vitals  Encounter Vitals Group     BP 11/22/23 0520 129/81     Systolic BP Percentile --      Diastolic BP Percentile --      Pulse Rate 11/22/23 0520 (!) 54     Resp 11/22/23 0520 17     Temp 11/22/23 0520 97.7 F (36.5 C)     Temp Source 11/22/23 0520 Oral     SpO2 11/22/23 0520 98 %     Weight 11/22/23 0519 190 lb (86.2 kg)     Height 11/22/23 0519 5\' 9"  (1.753 m)     Head Circumference --      Peak Flow --      Pain Score 11/22/23 0518 8     Pain Loc --      Pain Education --      Exclude from Growth Chart --     Most recent vital signs: Vitals:   11/22/23 0520  BP: 129/81  Pulse: (!) 54  Resp: 17  Temp: 97.7 F (36.5 C)  SpO2: 98%    Constitutional: Alert and oriented. Eyes: Conjunctivae are normal. Head: Atraumatic. Nose: No congestion/rhinnorhea. Mouth/Throat: Mucous membranes are moist.  Cracked right lower molar with surrounding erythema and mild edema, no focal fluctuance noted. Cardiovascular: Normal rate, regular rhythm. Grossly normal heart sounds.  2+ radial pulses bilaterally. Respiratory: Normal respiratory effort.  No retractions. Lungs CTAB. Gastrointestinal: Soft and nontender. No distention. Musculoskeletal: No lower extremity  tenderness nor edema.  Neurologic:  Normal speech and language. No gross focal neurologic deficits are appreciated.    ED Results / Procedures / Treatments   Labs (all labs ordered are listed, but only abnormal results are displayed) Labs Reviewed - No data to display   PROCEDURES:  Critical Care performed: No  Procedures   MEDICATIONS ORDERED IN ED: Medications  ibuprofen (ADVIL) tablet 400 mg (has no administration in time range)  amoxicillin-clavulanate (AUGMENTIN) 875-125 MG per tablet 1 tablet (has no administration in time range)     IMPRESSION / MDM / ASSESSMENT AND PLAN / ED COURSE  I reviewed the triage vital signs and the nursing notes.                              33 y.o. male with no significant past medical history who presents to the ED complaining of pain and swelling around his right lower molar for the past 5 days.  Patient's presentation is most consistent with acute, uncomplicated illness.  Differential diagnosis includes, but is not limited to, pulpitis, cracked tooth, dental abscess.  Patient well-appearing and in no  acute distress, vital signs are unremarkable.  He has a cracked right lower molar with signs of pulpitis, no evidence of abscess at this time.  He is appropriate for outpatient management, we will start on antibiotics and patient given a dose of ibuprofen for pain.  He was counseled to follow-up with dentistry and to return to the ED for new or worsening symptoms.  Patient agrees with plan.      FINAL CLINICAL IMPRESSION(S) / ED DIAGNOSES   Final diagnoses:  Pain, dental  Dental infection     Rx / DC Orders   ED Discharge Orders          Ordered    amoxicillin-clavulanate (AUGMENTIN) 875-125 MG tablet  2 times daily        11/22/23 0534             Note:  This document was prepared using Dragon voice recognition software and may include unintentional dictation errors.   Chesley Noon, MD 11/22/23 [phone]

## 2023-11-22 NOTE — ED Triage Notes (Signed)
Pt to ED via POV c/o toothache. Pt has cracked tooth on bottom right side causing him pain. Endorses some swelling.

## 2023-12-14 ENCOUNTER — Other Ambulatory Visit: Payer: Self-pay

## 2023-12-14 ENCOUNTER — Emergency Department
Admission: EM | Admit: 2023-12-14 | Discharge: 2023-12-14 | Disposition: A | Discharge status: Home / Self Care | Payer: Self-pay | Attending: Emergency Medicine | Admitting: Emergency Medicine

## 2023-12-14 ENCOUNTER — Encounter: Payer: Self-pay | Admitting: Emergency Medicine

## 2023-12-14 DIAGNOSIS — Z20822 Contact with and (suspected) exposure to covid-19: Secondary | ICD-10-CM | POA: Insufficient documentation

## 2023-12-14 DIAGNOSIS — R110 Nausea: Secondary | ICD-10-CM | POA: Insufficient documentation

## 2023-12-14 LAB — RESP PANEL BY RT-PCR (RSV, FLU A&B, COVID)  RVPGX2
Influenza A by PCR: NEGATIVE
Influenza B by PCR: NEGATIVE
Resp Syncytial Virus by PCR: NEGATIVE
SARS Coronavirus 2 by RT PCR: NEGATIVE

## 2023-12-14 NOTE — ED Triage Notes (Signed)
Patient ambulatory to triage with steady gait, without difficulty or distress noted; pt reports that he awoke an hr ago and felt "nauseous"; pt denies any other c/o, st "I just need a work note"

## 2023-12-14 NOTE — ED Notes (Signed)
Request for note for work due to his report of 1 episode of nausea tonight. Pt denies all symptoms now in ED.

## 2023-12-14 NOTE — ED Provider Notes (Signed)
[facility] Provider Note    Event Date/Time   First MD Initiated Contact with Patient 12/14/23 [phone]     (approximate)   History   Letter for School/Work   HPI COLTIN CASHER is a 33 y.o. male presenting today for nausea and a note for work.  Patient states waking up earlier today with nausea.  Denies any vomiting.  Did not go into work because of this.  Denies any other symptoms at this time.  No recent sick exposures.  Is tolerating p.o. at this time.  He states coming to the ED specifically just for a work note and nothing else.     Physical Exam   Triage Vital Signs: ED Triage Vitals  Encounter Vitals Group     BP 12/14/23 0204 122/76     Systolic BP Percentile --      Diastolic BP Percentile --      Pulse Rate 12/14/23 0204 (!) 56     Resp 12/14/23 0204 20     Temp 12/14/23 0204 97.7 F (36.5 C)     Temp Source 12/14/23 0204 Oral     SpO2 12/14/23 0204 97 %     Weight 12/14/23 0203 185 lb (83.9 kg)     Height 12/14/23 0203 5\' 9"  (1.753 m)     Head Circumference --      Peak Flow --      Pain Score 12/14/23 0203 0     Pain Loc --      Pain Education --      Exclude from Growth Chart --     Most recent vital signs: Vitals:   12/14/23 0204  BP: 122/76  Pulse: (!) 56  Resp: 20  Temp: 97.7 F (36.5 C)  SpO2: 97%   I have reviewed the vital signs. General:  Awake, alert, no acute distress. Head:  Normocephalic, Atraumatic. EENT:  PERRL, EOMI, Oral mucosa pink and moist, Neck is supple. Cardiovascular: Regular rate, 2+ distal pulses. Respiratory:  Normal respiratory effort, symmetrical expansion, no distress.   Extremities:  Moving all four extremities through full ROM without pain.   Neuro:  Alert and oriented.  Interacting appropriately.   Skin:  Warm, dry, no rash.   Psych: Appropriate affect.    ED Results / Procedures / Treatments   Labs (all labs ordered are listed, but only abnormal results are displayed) Labs  Reviewed  RESP PANEL BY RT-PCR (RSV, FLU A&B, COVID)  [id number]     EKG    RADIOLOGY    PROCEDURES:  Critical Care performed: No  Procedures   MEDICATIONS ORDERED IN ED: Medications - No data to display   IMPRESSION / MDM / ASSESSMENT AND PLAN / ED COURSE  I reviewed the triage vital signs and the nursing notes.                              Differential diagnosis includes, but is not limited to, viral URI, nausea  Patient's presentation is most consistent with acute complicated illness / injury requiring diagnostic workup.  Patient is a 32 year old male presenting today for nausea.  Vital signs are stable and physical exam unremarkable.  Tested negative for COVID, flu, and RSV.  I did offer him Zofran to treat his nausea.  Patient however stated that he has medication at home to treat this and he is simply here for a work note.  Will give patient  note stating that he was in the ED tonight for his work.  Patient otherwise safe for discharge.     FINAL CLINICAL IMPRESSION(S) / ED DIAGNOSES   Final diagnoses:  Nausea     Rx / DC Orders   ED Discharge Orders     None        Note:  This document was prepared using Dragon voice recognition software and may include unintentional dictation errors.   Janith Lima, MD 12/14/23 [phone]

## 2023-12-15 ENCOUNTER — Other Ambulatory Visit: Payer: Self-pay

## 2023-12-15 ENCOUNTER — Emergency Department
Admission: EM | Admit: 2023-12-15 | Discharge: 2023-12-15 | Disposition: A | Discharge status: Home / Self Care | Payer: Self-pay | Attending: Emergency Medicine | Admitting: Emergency Medicine

## 2023-12-15 DIAGNOSIS — R110 Nausea: Secondary | ICD-10-CM | POA: Insufficient documentation

## 2023-12-15 MED ORDER — ONDANSETRON HCL 4 MG PO TABS: 4.0000 mg | ORAL_TABLET | Freq: Three times a day (TID) | ORAL | 0 refills | Status: AC | PRN

## 2023-12-15 MED ORDER — ONDANSETRON 4 MG PO TBDP
4.0000 mg | ORAL_TABLET | Freq: Once | ORAL | Status: AC
  Administered 2023-12-15: 4 mg via ORAL
  Filled 2023-12-15: qty 1

## 2023-12-15 NOTE — ED Triage Notes (Signed)
Pt states he was seen here yesterday for a drs note because he was nauseous and states he is here today for the same.

## 2023-12-15 NOTE — Discharge Instructions (Signed)
I have sent nausea medication to your pharmacy for you to take as prescribed.

## 2023-12-15 NOTE — ED Provider Notes (Signed)
[facility] Provider Note    Event Date/Time   First MD Initiated Contact with Patient 12/15/23 [phone]     (approximate)   History   No chief complaint on file.   HPI Marcus Hill is a 33 y.o. male presenting today for nausea.  Patient was seen in the ED last night as well for a work note.  He is states ongoing nausea symptoms.  Has been taking DayQuil and NyQuil but no other nausea medication.  Denies any other symptoms at this time.  Patient states he is just here for a work note and does not want any additional workup.     Physical Exam   Triage Vital Signs: ED Triage Vitals  Encounter Vitals Group     BP 12/15/23 0324 (!) 127/93     Systolic BP Percentile --      Diastolic BP Percentile --      Pulse Rate 12/15/23 0324 63     Resp 12/15/23 0324 18     Temp 12/15/23 0324 97.8 F (36.6 C)     Temp Source 12/15/23 0324 Oral     SpO2 12/15/23 0324 96 %     Weight 12/15/23 0323 184 lb 15.5 oz (83.9 kg)     Height 12/15/23 0323 5\' 8"  (1.727 m)     Head Circumference --      Peak Flow --      Pain Score 12/15/23 0323 0     Pain Loc --      Pain Education --      Exclude from Growth Chart --     Most recent vital signs: Vitals:   12/15/23 0324  BP: (!) 127/93  Pulse: 63  Resp: 18  Temp: 97.8 F (36.6 C)  SpO2: 96%   I have reviewed the vital signs. General:  Awake, alert, no acute distress. Head:  Normocephalic, Atraumatic. EENT:  PERRL, EOMI, Oral mucosa pink and moist, Neck is supple. Cardiovascular: Regular rate, 2+ distal pulses. Respiratory:  Normal respiratory effort, symmetrical expansion, no distress.   Extremities:  Moving all four extremities through full ROM without pain.   Neuro:  Alert and oriented.  Interacting appropriately.   Skin:  Warm, dry, no rash.   Psych: Appropriate affect.    ED Results / Procedures / Treatments   Labs (all labs ordered are listed, but only abnormal results are displayed) Labs  Reviewed - No data to display   EKG    RADIOLOGY    PROCEDURES:  Critical Care performed: No  Procedures   MEDICATIONS ORDERED IN ED: Medications  ondansetron (ZOFRAN-ODT) disintegrating tablet 4 mg (has no administration in time range)     IMPRESSION / MDM / ASSESSMENT AND PLAN / ED COURSE  I reviewed the triage vital signs and the nursing notes.                              Differential diagnosis includes, but is not limited to, nausea, work note  Patient's presentation is most consistent with acute, uncomplicated illness.  Patient is a 33 year old male presenting today for ongoing nausea and needing a work note.  Seen in the ED for the same symptoms last night.  Has not taken any nausea medication.  Will give Zofran here and discharged on the same.  Does not want any additional workup and just wants a work note.     FINAL CLINICAL IMPRESSION(S) /  ED DIAGNOSES   Final diagnoses:  Nausea     Rx / DC Orders   ED Discharge Orders          Ordered    ondansetron (ZOFRAN) 4 MG tablet  Every 8 hours PRN        12/15/23 0351             Note:  This document was prepared using Dragon voice recognition software and may include unintentional dictation errors.   Janith Lima, MD 12/15/23 [phone]

## 2024-01-04 ENCOUNTER — Other Ambulatory Visit: Payer: Self-pay

## 2024-01-04 ENCOUNTER — Emergency Department
Admission: EM | Admit: 2024-01-04 | Discharge: 2024-01-04 | Disposition: A | Discharge status: Home / Self Care | Payer: Self-pay | Attending: Emergency Medicine | Admitting: Emergency Medicine

## 2024-01-04 DIAGNOSIS — Z87891 Personal history of nicotine dependence: Secondary | ICD-10-CM | POA: Insufficient documentation

## 2024-01-04 DIAGNOSIS — K047 Periapical abscess without sinus: Secondary | ICD-10-CM | POA: Insufficient documentation

## 2024-01-04 MED ORDER — IBUPROFEN 600 MG PO TABS
600.0000 mg | ORAL_TABLET | Freq: Once | ORAL | Status: AC
  Administered 2024-01-04: 600 mg via ORAL
  Filled 2024-01-04: qty 1

## 2024-01-04 MED ORDER — NAPROXEN 500 MG PO TABS: 500.0000 mg | ORAL_TABLET | Freq: Two times a day (BID) | ORAL | 0 refills | Status: AC

## 2024-01-04 MED ORDER — AMOXICILLIN-POT CLAVULANATE 875-125 MG PO TABS: 1.0000 | ORAL_TABLET | Freq: Two times a day (BID) | ORAL | 0 refills | Status: AC

## 2024-01-04 NOTE — ED Provider Notes (Signed)
   [facility] Provider Note    Event Date/Time   First MD Initiated Contact with Patient 01/04/24 [phone]     (approximate)   History   Dental Pain   HPI  Marcus Hill is a 33 y.o. male with a history of tobacco abuse who comes ED complaining of left upper jaw dental pain for the last few weeks, feels like a dental infection.  Denies fever or swelling.  No difficulty with swallowing or breathing.     Physical Exam   Triage Vital Signs: ED Triage Vitals [01/04/24 0218]  Encounter Vitals Group     BP (!) 142/93     Systolic BP Percentile      Diastolic BP Percentile      Pulse Rate (!) 57     Resp 18     Temp 97.9 F (36.6 C)     Temp Source Oral     SpO2 99 %     Weight 190 lb (86.2 kg)     Height 5\' 9"  (1.753 m)     Head Circumference      Peak Flow      Pain Score 9     Pain Loc      Pain Education      Exclude from Growth Chart     Most recent vital signs: Vitals:   01/04/24 0218  BP: (!) 142/93  Pulse: (!) 57  Resp: 18  Temp: 97.9 F (36.6 C)  SpO2: 99%    General: Awake, no distress.  CV:  Good peripheral perfusion.  Resp:  Normal effort.  Abd:  No distention.  Other:  There is some gingival swelling around the left upper molars.  No purulent discharge, no fluctuance.  No facial cellulitis.   ED Results / Procedures / Treatments   Labs (all labs ordered are listed, but only abnormal results are displayed) Labs Reviewed - No data to display   RADIOLOGY    PROCEDURES:  Procedures   MEDICATIONS ORDERED IN ED: Medications  ibuprofen (ADVIL) tablet 600 mg (has no administration in time range)     IMPRESSION / MDM / ASSESSMENT AND PLAN / ED COURSE  I reviewed the triage vital signs and the nursing notes.                             Patient presents with dental infection.  Will give Augmentin, NSAIDs.  Follow-up with dentistry.      FINAL CLINICAL IMPRESSION(S) / ED DIAGNOSES   Final diagnoses:   Dental infection     Rx / DC Orders   ED Discharge Orders          Ordered    amoxicillin-clavulanate (AUGMENTIN) 875-125 MG tablet  2 times daily        01/04/24 0423    naproxen (NAPROSYN) 500 MG tablet  2 times daily with meals        01/04/24 0423             Note:  This document was prepared using Dragon voice recognition software and may include unintentional dictation errors.   Sharman Cheek, MD 01/04/24 [phone]

## 2024-01-04 NOTE — ED Triage Notes (Signed)
 Pt reports left top tooth pain x weeks
